# Patient Record
Sex: Male | Born: 1950 | Race: White | Hispanic: No | Marital: Single | Smoking: Former smoker
Health system: Southern US, Community
[De-identification: ages and names within clinical notes are randomized; demographics above are authoritative.]

## PROBLEM LIST (undated history)

## (undated) DIAGNOSIS — Z5189 Encounter for other specified aftercare: Secondary | ICD-10-CM

## (undated) DIAGNOSIS — I2510 Atherosclerotic heart disease of native coronary artery without angina pectoris: Secondary | ICD-10-CM

## (undated) DIAGNOSIS — M1990 Unspecified osteoarthritis, unspecified site: Secondary | ICD-10-CM

## (undated) DIAGNOSIS — I10 Essential (primary) hypertension: Secondary | ICD-10-CM

## (undated) DIAGNOSIS — K219 Gastro-esophageal reflux disease without esophagitis: Secondary | ICD-10-CM

## (undated) DIAGNOSIS — E785 Hyperlipidemia, unspecified: Secondary | ICD-10-CM

## (undated) DIAGNOSIS — L501 Idiopathic urticaria: Secondary | ICD-10-CM

## (undated) DIAGNOSIS — L309 Dermatitis, unspecified: Secondary | ICD-10-CM

## (undated) HISTORY — DX: Unspecified osteoarthritis, unspecified site: M19.90

## (undated) HISTORY — DX: Essential (primary) hypertension: I10

## (undated) HISTORY — DX: Idiopathic urticaria: L50.1

## (undated) HISTORY — DX: Hyperlipidemia, unspecified: E78.5

## (undated) HISTORY — DX: Encounter for other specified aftercare: Z51.89

## (undated) HISTORY — DX: Atherosclerotic heart disease of native coronary artery without angina pectoris: I25.10

---
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
## 2002-10-23 HISTORY — PX: ANAL FISTULECTOMY: SHX1139

## 2006-10-23 HISTORY — PX: CORONARY STENT PLACEMENT: SHX1402

## 2007-08-24 ENCOUNTER — Emergency Department (HOSPITAL_COMMUNITY): Admission: EM | Admit: 2007-08-24 | Discharge: 2007-08-24 | Payer: Self-pay | Admitting: Emergency Medicine

## 2007-09-16 ENCOUNTER — Inpatient Hospital Stay (HOSPITAL_COMMUNITY): Admission: EM | Admit: 2007-09-16 | Discharge: 2007-09-18 | Payer: Self-pay | Admitting: Family Medicine

## 2007-09-16 ENCOUNTER — Ambulatory Visit: Payer: Self-pay | Admitting: Internal Medicine

## 2007-09-16 DIAGNOSIS — Z955 Presence of coronary angioplasty implant and graft: Secondary | ICD-10-CM

## 2007-09-16 HISTORY — DX: Presence of coronary angioplasty implant and graft: Z95.5

## 2007-09-18 ENCOUNTER — Ambulatory Visit: Payer: Self-pay | Admitting: *Deleted

## 2007-09-18 ENCOUNTER — Encounter: Payer: Self-pay | Admitting: Cardiology

## 2007-09-23 ENCOUNTER — Ambulatory Visit: Payer: Self-pay | Admitting: Cardiology

## 2007-11-07 ENCOUNTER — Ambulatory Visit: Payer: Self-pay | Admitting: Cardiology

## 2007-11-07 LAB — CONVERTED CEMR LAB
AST: 30 units/L (ref 0–37)
Alkaline Phosphatase: 56 units/L (ref 39–117)
Cholesterol: 148 mg/dL (ref 0–200)
Total Protein: 6.9 g/dL (ref 6.0–8.3)
Triglycerides: 44 mg/dL (ref 0–149)
VLDL: 9 mg/dL (ref 0–40)

## 2008-01-03 ENCOUNTER — Ambulatory Visit: Payer: Self-pay | Admitting: Internal Medicine

## 2008-06-11 ENCOUNTER — Ambulatory Visit: Payer: Self-pay | Admitting: Internal Medicine

## 2008-06-22 ENCOUNTER — Emergency Department (HOSPITAL_COMMUNITY): Admission: EM | Admit: 2008-06-22 | Discharge: 2008-06-22 | Payer: Self-pay | Admitting: Family Medicine

## 2009-01-15 ENCOUNTER — Encounter (INDEPENDENT_AMBULATORY_CARE_PROVIDER_SITE_OTHER): Payer: Self-pay | Admitting: *Deleted

## 2009-01-15 DIAGNOSIS — I2510 Atherosclerotic heart disease of native coronary artery without angina pectoris: Secondary | ICD-10-CM

## 2009-01-15 DIAGNOSIS — I10 Essential (primary) hypertension: Secondary | ICD-10-CM

## 2009-01-15 DIAGNOSIS — E785 Hyperlipidemia, unspecified: Secondary | ICD-10-CM

## 2009-01-15 HISTORY — DX: Atherosclerotic heart disease of native coronary artery without angina pectoris: I25.10

## 2009-01-15 HISTORY — DX: Hyperlipidemia, unspecified: E78.5

## 2009-01-15 HISTORY — DX: Essential (primary) hypertension: I10

## 2009-02-03 ENCOUNTER — Ambulatory Visit: Payer: Self-pay | Admitting: Internal Medicine

## 2009-02-03 LAB — CONVERTED CEMR LAB
AST: 32 units/L (ref 0–37)
Cholesterol: 228 mg/dL — ABNORMAL HIGH (ref 0–200)
Direct LDL: 132.7 mg/dL
HDL: 67.5 mg/dL (ref >39.00)
Total CHOL/HDL Ratio: 3

## 2009-02-11 ENCOUNTER — Encounter: Payer: Self-pay | Admitting: Internal Medicine

## 2009-02-11 ENCOUNTER — Ambulatory Visit: Payer: Self-pay | Admitting: Internal Medicine

## 2009-02-26 ENCOUNTER — Telehealth: Payer: Self-pay | Admitting: Internal Medicine

## 2009-03-01 ENCOUNTER — Telehealth: Payer: Self-pay | Admitting: Internal Medicine

## 2009-04-02 ENCOUNTER — Telehealth: Payer: Self-pay | Admitting: Internal Medicine

## 2009-07-09 ENCOUNTER — Ambulatory Visit: Payer: Self-pay | Admitting: Family Medicine

## 2009-07-09 DIAGNOSIS — L501 Idiopathic urticaria: Secondary | ICD-10-CM | POA: Insufficient documentation

## 2009-07-09 HISTORY — DX: Idiopathic urticaria: L50.1

## 2009-09-08 ENCOUNTER — Encounter (INDEPENDENT_AMBULATORY_CARE_PROVIDER_SITE_OTHER): Payer: Self-pay | Admitting: *Deleted

## 2009-10-12 ENCOUNTER — Telehealth: Payer: Self-pay | Admitting: Family Medicine

## 2009-11-15 ENCOUNTER — Telehealth: Payer: Self-pay | Admitting: Internal Medicine

## 2009-11-25 ENCOUNTER — Ambulatory Visit: Payer: Self-pay | Admitting: Internal Medicine

## 2009-12-21 HISTORY — PX: WRIST FUSION: SHX839

## 2010-05-27 ENCOUNTER — Telehealth: Payer: Self-pay | Admitting: Internal Medicine

## 2010-11-16 ENCOUNTER — Other Ambulatory Visit: Payer: Self-pay | Admitting: Family Medicine

## 2010-11-16 ENCOUNTER — Telehealth: Payer: Self-pay | Admitting: Family Medicine

## 2010-11-16 ENCOUNTER — Ambulatory Visit
Admission: RE | Admit: 2010-11-16 | Discharge: 2010-11-16 | Payer: Self-pay | Source: Home / Self Care | Attending: Family Medicine | Admitting: Family Medicine

## 2010-11-16 LAB — BASIC METABOLIC PANEL
CO2: 26 mEq/L (ref 19–32)
Chloride: 104 mEq/L (ref 96–112)
Glucose, Bld: 83 mg/dL (ref 70–99)
Sodium: 139 mEq/L (ref 135–145)

## 2010-11-16 LAB — CBC WITH DIFFERENTIAL/PLATELET
Basophils Relative: 0.8 % (ref 0.0–3.0)
Eosinophils Absolute: 0.3 10*3/uL (ref 0.0–0.7)
Eosinophils Relative: 5.8 % — ABNORMAL HIGH (ref 0.0–5.0)
HCT: 40.4 % (ref 39.0–52.0)
Hemoglobin: 13.9 g/dL (ref 13.0–17.0)
Lymphocytes Relative: 52.7 % — ABNORMAL HIGH (ref 12.0–46.0)
MCV: 91.8 fl (ref 78.0–100.0)
Monocytes Absolute: 0.2 10*3/uL (ref 0.1–1.0)
Monocytes Relative: 5 % (ref 3.0–12.0)
Neutro Abs: 1.7 10*3/uL (ref 1.4–7.7)
Platelets: 164 10*3/uL (ref 150.0–400.0)
RBC: 4.4 Mil/uL (ref 4.22–5.81)
RDW: 13.5 % (ref 11.5–14.6)

## 2010-11-16 LAB — URINALYSIS, ROUTINE W REFLEX MICROSCOPIC
Bilirubin Urine: NEGATIVE
Specific Gravity, Urine: 1.02 (ref 1.000–1.030)
Total Protein, Urine: NEGATIVE
pH: 5.5 (ref 5.0–8.0)

## 2010-11-16 LAB — TSH: TSH: 1.01 u[IU]/mL (ref 0.35–5.50)

## 2010-11-16 LAB — PSA: PSA: 1.03 ng/mL (ref 0.10–4.00)

## 2010-11-16 LAB — TESTOSTERONE: Testosterone: 467.03 ng/dL (ref 350.00–890.00)

## 2010-11-16 LAB — LIPID PANEL
Cholesterol: 139 mg/dL (ref 0–200)
Triglycerides: 41 mg/dL (ref 0.0–149.0)

## 2010-11-20 LAB — CONVERTED CEMR LAB
AST: 31 units/L (ref 0–37)
LDL Cholesterol: 69 mg/dL (ref 0–99)
VLDL: 11.8 mg/dL (ref 0.0–40.0)

## 2010-11-21 ENCOUNTER — Ambulatory Visit
Admission: RE | Admit: 2010-11-21 | Discharge: 2010-11-21 | Payer: Self-pay | Source: Home / Self Care | Attending: Family Medicine | Admitting: Family Medicine

## 2010-11-24 NOTE — Progress Notes (Signed)
Summary: please order testosterone lab  Phone Note Call from Patient Call back at Home Phone [phone]   Caller: Patient---live call Reason for Call: Acute Illness Summary of Call: pt just had v70 labs this morning and is requesting testosterone to be checked. is this ok to add? please order. Initial call taken by: Warnell Forester,  November 16, 2010 8:08 AM  Follow-up for Phone Call        OK to add Follow-up by: Evelena Peat MD,  November 16, 2010 12:47 PM  Additional Follow-up for Phone Call Additional follow up Details #1::        spoke with gwen. will send add on sheet. pt is aware. Additional Follow-up by: Warnell Forester,  November 16, 2010 3:24 PM

## 2010-11-24 NOTE — Assessment & Plan Note (Signed)
Summary: 9 MO F/U   Primary Provider:  Evelena Peat MD   History of Present Illness: Patient is a 60 year old with a history of CAD (s/p PTCA/stent to the LAD in 2008.  I last saw him in april 2010.  Since seen, he has done well from a cardiac standpoint.  He denies CP, no shortness of breath.  He remains fairly active though weather and hand injury have slowed him some. [facility]  It is usually in 120s / 70s.   Current Medications (verified): 1)  Aspirin 81 Mg Tbec (Aspirin) .... Take One Tablet By Mouth 2 Times Daily 2)  Nitroglycerin 0.4 Mg Subl (Nitroglycerin) .... One Tablet Under Tongue Every 5 Minutes As Needed For Chest Pain---May Repeat Times Three 3)  Lipitor 40 Mg Tabs (Atorvastatin Calcium) .... Take One Tablet By Mouth Daily. 4)  Desoximetasone 0.25 % Crea (Desoximetasone) .... Apply To Effected Area As Needed 60 Gm Tube  Allergies (verified): 1)  ! Augmentin  Past History:  Past Medical History: Last updated: 07/09/2009 HYPERTENSION, UNSPECIFIED (ICD-401.9) DYSLIPIDEMIA (ICD-272.4) CAD (ICD-414.00)  UTI  Past Surgical History: Last updated: 07/09/2009 He has had a colonoscopy.  Social History: Last updated: 07/09/2009 Full Time Tobacco Use - Quit 03/2008 Alcohol Use - yes -- 12 pack or more per day on weekend Regular Exercise - no Drug Use - no excessive caffeine  Review of Systems       Question allergic rxn to Augmentin   Vital Signs:  Patient profile:   60 year old male Height:      69.75 inches Weight:      183 pounds BMI:     26.54 Pulse rate:   64 / minute Resp:     16 per minute BP sitting:   147 / 88  (right arm)  Vitals Entered By: Marrion Coy, CNA (November 25, 2009 8:29 AM)  Physical Exam  Additional Exam:  HEENT:  Normocephalic, atraumatic. EOMI, PERRLA.  Neck: JVP is normal. No thyromegaly. No bruits.  Lungs: clear to auscultation. No rales no wheezes.  Heart: Regular rate  and rhythm. Normal S1, S2. No S3.   No significant murmurs. PMI not displaced.  Abdomen:  Supple, nontender. Normal bowel sounds. No masses. No hepatomegaly.  Extremities:   Good distal pulses throughout. No lower extremity edema.   L arm in splint. Musculoskeletal :moving all extremities.  Neuro:   alert and oriented x3.    EKG  Procedure date:  11/25/2009  Findings:      NSR.  64 bpm  Impression & Recommendations:  Problem # 1:  CAD (ICD-414.00) Clinically stable.  No angina.  Continue to follow.  Stay active.  Problem # 2:  DYSLIPIDEMIA (ICD-272.4) Will check lipids today.  Patient did have a cup of coffee with a little bit of half and half a few hours ago.  Will take in acct when get numbers.  Problem # 3:  HYPERTENSION, UNSPECIFIED (ICD-401.9) BP on my check is 120/90.  May have a little white coat effect.  Told him to continue to check fairly regularly.  If greater than 145/88 call.  Other Orders: EKG w/ Interpretation (93000) TLB-AST (SGOT) (84450-SGOT) TLB-Lipid Panel (80061-LIPID)  Patient Instructions: 1)  Your physician recommends that you return for lab work in: lab work today..we will call you with results 2)  Your physician wants you to follow-up in: 12 months  You will receive a reminder letter  in the mail two months in advance. If you don't receive a letter, please call our office to schedule the follow-up appointment.

## 2010-11-24 NOTE — Progress Notes (Signed)
Summary: needs lipitor resent  Phone Note Call from Patient   Caller: Patient  [phone] Reason for Call: Talk to Nurse Summary of Call: pt needs lipitor rx resent-they told him it was denied-it shows approved  Initial call taken by: Glynda Jaeger,  May 27, 2010 11:46 AM  Follow-up for Phone Call        resent Follow-up by: Burnett Kanaris, CNA,  May 27, 2010 4:44 PM

## 2010-11-24 NOTE — Progress Notes (Signed)
Summary: refill  Phone Note Refill Request Message from:  Patient on November 15, 2009 9:57 AM  Refills Requested: Medication #1:  LIPITOR 40 MG TABS Take one tablet by mouth daily. [facility]  [phone]  Initial call taken by: Judie Grieve,  November 15, 2009 9:58 AM  Follow-up for Phone Call        [facility] Follow-up by: Oswald Hillock,  November 15, 2009 1:13 PM    Prescriptions: LIPITOR 40 MG TABS (ATORVASTATIN CALCIUM) Take one tablet by mouth daily.  #90 x 0   Entered by:   Oswald Hillock   Authorized by:   Sherrill Raring, MD, [facility]   Signed by:   Oswald Hillock on 11/15/2009   Method used:   [facility]        [phone] ([facility]       [address] [place], [place]  [address]       Ph: [phone] or [phone]       Fax: [phone]   RxID:   [id number]

## 2010-11-30 NOTE — Assessment & Plan Note (Signed)
Summary: cpx/njr   Vital Signs:  Patient profile:   60 year old male Height:      69.75 inches Weight:      175 pounds BMI:     25.38 Temp:     97.8 degrees F oral Pulse rate:   72 / minute Pulse rhythm:   regular Resp:     12 per minute BP sitting:   142 / 80  (left arm) Cuff size:   regular  Vitals Entered By: Sid Falcon LPN (November 21, 2010 11:22 AM)  Nutrition Counseling: Patient's BMI is greater than 25 and therefore counseled on weight management options. CC: CPX   History of Present Illness: Patient for complete physical examination. He has history of CAD which has been stable. Followed by cardiologist. History of dyslipidemia treated with Lipitor. Patient compliant with therapy.  Prior colonoscopy 2004.  tetanus is up to date. No hx of pneumovax.  Clinical Review Panels:  Prevention   Last Colonoscopy:  normal (10/23/2002)   Last PSA:  1.03 (11/16/2010)  Immunizations   Last Tetanus Booster:  Historical (10/24/2007)   Last Pneumovax:  Pneumovax (11/21/2010)  Lipid Management   Cholesterol:  139 (11/16/2010)   LDL (bad choesterol):  59 (11/16/2010)   HDL (good cholesterol):  71.60 (11/16/2010)  Diabetes Management   Creatinine:  0.8 (11/16/2010)   Last Pneumovax:  Pneumovax (11/21/2010)  CBC   WBC:  4.8 (11/16/2010)   RBC:  4.40 (11/16/2010)   Hgb:  13.9 (11/16/2010)   Hct:  40.4 (11/16/2010)   Platelets:  164.0 (11/16/2010)   MCV  91.8 (11/16/2010)   MCHC  34.3 (11/16/2010)   RDW  13.5 (11/16/2010)   PMN:  35.7 (11/16/2010)   Lymphs:  52.7 (11/16/2010)   Monos:  5.0 (11/16/2010)   Eosinophils:  5.8 (11/16/2010)   Basophil:  0.8 (11/16/2010)  Complete Metabolic Panel   Glucose:  83 (11/16/2010)   Sodium:  139 (11/16/2010)   Potassium:  4.4 (11/16/2010)   Chloride:  104 (11/16/2010)   CO2:  26 (11/16/2010)   BUN:  10 (11/16/2010)   Creatinine:  0.8 (11/16/2010)   Albumin:  4.2 (11/16/2010)   Total Protein:  6.7 (11/16/2010)   Calcium:   9.3 (11/16/2010)   Total Bili:  0.9 (11/16/2010)   Alk Phos:  50 (11/16/2010)   SGPT (ALT):  47 (11/16/2010)   SGOT (AST):  45 (11/16/2010)   Allergies: 1)  ! Augmentin  Past History:  Past Medical History: Last updated: 07/09/2009 HYPERTENSION, UNSPECIFIED (ICD-401.9) DYSLIPIDEMIA (ICD-272.4) CAD (ICD-414.00)  UTI  Past Surgical History: Last updated: 07/09/2009 He has had a colonoscopy.  Family History: Last updated: 01/15/2009  He has family history of premature coronary artery   disease and ongoing tobacco use.  Of note, he has not had a checkup in   greater than ten years.  His mother died at age 41 of lung disease and   he thinks she had an MI at age 4.  His father died at age 32 of heart   failure and the patient states he also had a history of angina.  He has   two siblings that have died of cancer and two that are healthy.   Social History: Last updated: 07/09/2009 Full Time Tobacco Use - Quit 03/2008 Alcohol Use - yes -- 12 pack or more per day on weekend Regular Exercise - no Drug Use - no excessive caffeine  Risk Factors: Exercise: no (01/15/2009)  Risk Factors: Smoking Status: current (01/15/2009) PMH-FH-SH reviewed  for relevance  Review of Systems  The patient denies anorexia, fever, weight gain, vision loss, decreased hearing, hoarseness, chest pain, syncope, dyspnea on exertion, peripheral edema, prolonged cough, headaches, hemoptysis, abdominal pain, melena, hematochezia, severe indigestion/heartburn, hematuria, incontinence, genital sores, muscle weakness, suspicious skin lesions, transient blindness, difficulty walking, depression, unusual weight change, abnormal bleeding, enlarged lymph nodes, and testicular masses.         recent fatigue issues with normal testosterone levels.  Physical Exam  General:  Well-developed,well-nourished,in no acute distress; alert,appropriate and cooperative throughout examination Head:  Normocephalic and  atraumatic without obvious abnormalities. No apparent alopecia or balding. Eyes:  No corneal or conjunctival inflammation noted. EOMI. Perrla. Funduscopic exam benign, without hemorrhages, exudates or papilledema. Vision grossly normal. Ears:  External ear exam shows no significant lesions or deformities.  Otoscopic examination reveals clear canals, tympanic membranes are intact bilaterally without bulging, retraction, inflammation or discharge. Hearing is grossly normal bilaterally. Mouth:  Oral mucosa and oropharynx without lesions or exudates.  Teeth in good repair. Neck:  No deformities, masses, or tenderness noted. Lungs:  Normal respiratory effort, chest expands symmetrically. Lungs are clear to auscultation, no crackles or wheezes. Heart:  normal rate, regular rhythm, and no gallop.   Abdomen:  Bowel sounds positive,abdomen soft and non-tender without masses, organomegaly or hernias noted. Rectal:  No external abnormalities noted. Normal sphincter tone. No rectal masses or tenderness. Prostate:  Prostate gland firm and smooth, no enlargement, nodularity, tenderness, mass, asymmetry or induration. Msk:  No deformity or scoliosis noted of thoracic or lumbar spine.   Extremities:  No clubbing, cyanosis, edema, or deformity noted with normal full range of motion of all joints.   Neurologic:  alert & oriented X3, cranial nerves II-XII intact, and strength normal in all extremities.   Skin:  no rashes and no suspicious lesions.   Cervical Nodes:  No lymphadenopathy noted Psych:  Cognition and judgment appear intact. Alert and cooperative with normal attention span and concentration. No apparent delusions, illusions, hallucinations   Impression & Recommendations:  Problem # 1:  Preventive Health Care (ICD-V70.0) PVX recommended and given after pt consent.  Labs reviewed with pt.  Hemoccults given.  Complete Medication List: 1)  Aspirin 81 Mg Tbec (Aspirin) .... Take one tablet by mouth 2  times daily 2)  Nitroglycerin 0.4 Mg Subl (Nitroglycerin) .... One tablet under tongue every 5 minutes as needed for chest pain---may repeat times three 3)  Lipitor 40 Mg Tabs (Atorvastatin calcium) .... Take one tablet by mouth daily. 4)  Desoximetasone 0.25 % Crea (Desoximetasone) .... Apply to effected area as needed 60 gm tube 5)  Xalatan 0.005 % Soln (Latanoprost) .... One drop to each eye daily as needed  Other Orders: Pneumococcal Vaccine (16109) Admin 1st Vaccine (60454)  Patient Instructions: 1)  Please schedule a follow-up appointment in 1 year.  2)  It is important that you exercise reguarly at least 20 minutes 5 times a week. If you develop chest pain, have severe difficulty breathing, or feel very tired, stop exercising immediately and seek medical attention.    Orders Added: 1)  Pneumococcal Vaccine [90732] 2)  Admin 1st Vaccine [90471] 3)  Est. Patient 40-64 years [99396]   Immunizations Administered:  Pneumonia Vaccine:    Vaccine Type: Pneumovax    Site: left deltoid    Mfr: Merck    Dose: 0.5 ml    Route: IM    Given by: Sid Falcon LPN    Exp. Date: 03/16/2012  Lot #: [id number]   Immunizations Administered:  Pneumonia Vaccine:    Vaccine Type: Pneumovax    Site: left deltoid    Mfr: Merck    Dose: 0.5 ml    Route: IM    Given by: Sid Falcon LPN    Exp. Date: 03/16/2012    Lot #: [id number]

## 2010-12-16 ENCOUNTER — Ambulatory Visit: Payer: Self-pay | Admitting: Internal Medicine

## 2010-12-23 ENCOUNTER — Encounter: Payer: Self-pay | Admitting: Internal Medicine

## 2010-12-23 ENCOUNTER — Ambulatory Visit (INDEPENDENT_AMBULATORY_CARE_PROVIDER_SITE_OTHER): Payer: PRIVATE HEALTH INSURANCE | Admitting: Internal Medicine

## 2010-12-23 DIAGNOSIS — I2510 Atherosclerotic heart disease of native coronary artery without angina pectoris: Secondary | ICD-10-CM

## 2010-12-23 DIAGNOSIS — E7800 Pure hypercholesterolemia, unspecified: Secondary | ICD-10-CM

## 2010-12-29 NOTE — Assessment & Plan Note (Signed)
Summary: per checkout/sf   Visit Type:  Follow-up Primary Provider:  Evelena Peat MD  CC:  no complaints.  History of Present Illness: Patient is a 60 year old with a history of CAD (s/p PTCA/stent to the LAD in 2008.  I last saw him in sping 2011. Since see he has been followed by B. Burchette.  Lipid values drawn.  LDL was 59, HDL was 71 He remains very active.  No chest pain.  NO SOB.  Keeps active.  Exercises on bike 1 hr per day.  Current Medications (verified): 1)  Aspirin 81 Mg Tbec (Aspirin) .... Take One Tablet By Mouth 2 Times Daily 2)  Nitroglycerin 0.4 Mg Subl (Nitroglycerin) .... One Tablet Under Tongue Every 5 Minutes As Needed For Chest Pain---May Repeat Times Three 3)  Lipitor 40 Mg Tabs (Atorvastatin Calcium) .... Take One Tablet By Mouth Daily. 4)  Desoximetasone 0.25 % Crea (Desoximetasone) .... Apply To Effected Area As Needed 60 Gm Tube 5)  Xalatan 0.005 % Soln (Latanoprost) .... One Drop To Each Eye Daily As Needed  Allergies (verified): 1)  ! Augmentin  Past History:  Past medical, surgical, family and social histories (including risk factors) reviewed, and no changes noted (except as noted below).  Past Medical History: Reviewed history from 07/09/2009 and no changes required. HYPERTENSION, UNSPECIFIED (ICD-401.9) DYSLIPIDEMIA (ICD-272.4) CAD (ICD-414.00)  UTI  Past Surgical History: Reviewed history from 07/09/2009 and no changes required. He has had a colonoscopy.  Family History: Reviewed history from 01/15/2009 and no changes required.  He has family history of premature coronary artery   disease and ongoing tobacco use.  Of note, he has not had a checkup in   greater than ten years.  His mother died at age 21 of lung disease and   he thinks she had an MI at age 61.  His father died at age 72 of heart   failure and the patient states he also had a history of angina.  He has   two siblings that have died of cancer and two that are healthy.    Social History: Reviewed history from 07/09/2009 and no changes required. Full Time Tobacco Use - Quit 03/2008 Alcohol Use - yes -- 12 pack or more per day on weekend Regular Exercise - no Drug Use - no excessive caffeine  Review of Systems       All systems reviewed.  Neg to the above problem except as noted above.  Vital Signs:  Patient profile:   60 year old male Height:      69 inches Weight:      175 pounds BMI:     25.94 Pulse rate:   61 / minute BP sitting:   119 / 76  (left arm) Cuff size:   regular  Vitals Entered By: Burnett Kanaris, CNA (December 23, 2010 4:23 PM)  Physical Exam  Additional Exam:  Patient is in NAD HEENT:  Normocephalic, atraumatic. EOMI, PERRLA.  Neck: JVP is normal. No thyromegaly. No bruits.  Lungs: clear to auscultation. No rales no wheezes.  Heart: Regular rate and rhythm. Normal S1, S2. No S3.   No significant murmurs. PMI not displaced.  Abdomen:  Supple, nontender. Normal bowel sounds. No masses. No hepatomegaly.  Extremities:   Good distal pulses throughout. No lower extremity edema.  Musculoskeletal :moving all extremities.  Neuro:   alert and oriented x3.    EKG  Procedure date:  12/23/2010  Findings:      NSR.  60 bpm.  Impression & Recommendations:  Problem # 1:  CAD (ICD-414.00) Doing very well.  Continues to stay active.  No changes  Problem # 2:  HYPERTENSION, UNSPECIFIED (ICD-401.9) BP is good.  Problem # 3:  DYSLIPIDEMIA (ICD-272.4) Lipids are excellen.  Patinet would like to cut back on Lipitor some.  Would decrease to 20.  Check  Lipomed in 3 months. His updated medication list for this problem includes:    Lipitor 40 Mg Tabs (Atorvastatin calcium) .Marland Kitchen... Take one half tablet by mouth daily.  Other Orders: EKG w/ Interpretation (93000)  Patient Instructions: 1)  Your physician recommends that you return for lab work in: LIPOMED at [facility] lab FASTING ist week of JUNE 2)  Your physician wants you to follow-up  in: 12 months with Dr.Inez Stantz  You will receive a reminder letter in the mail two months in advance. If you don't receive a letter, please call our office to schedule the follow-up appointment.

## 2011-02-23 ENCOUNTER — Other Ambulatory Visit: Payer: Self-pay | Admitting: *Deleted

## 2011-02-23 MED ORDER — ATORVASTATIN CALCIUM 40 MG PO TABS: 20.0000 mg | ORAL_TABLET | Freq: Every day | ORAL | Status: DC

## 2011-02-24 ENCOUNTER — Other Ambulatory Visit: Payer: Self-pay | Admitting: *Deleted

## 2011-02-24 MED ORDER — ATORVASTATIN CALCIUM 40 MG PO TABS: 40.0000 mg | ORAL_TABLET | Freq: Every day | ORAL | Status: DC

## 2011-03-07 NOTE — H&P (Signed)
NAMEJERRALD, [name] NO.:  [id number]   MEDICAL RECORD NO.:  [id number]          PATIENT TYPE:  OBV   LOCATION:  6522                         FACILITY:  [facility]   PHYSICIAN:  Pricilla Riffle, MD, FACCDATE OF BIRTH:  15-Oct-1951   DATE OF ADMISSION:  09/16/2007  DATE OF DISCHARGE:                              HISTORY & PHYSICAL   PRIMARY CARE PHYSICIAN:  In the past, was Dr. Kelle Darting but has not  seen him in greater than five years.   CHIEF COMPLAINT:  Chest pain.   HISTORY OF PRESENT ILLNESS:  Mr. [name] is a 60 year old male with no  previous history of coronary artery disease or cardiac evaluation.  He  had an episode of chest pain two days ago in the evening after having  done an unusual amount of yard work that day.  The symptoms resolved  with rest.  He describes them as a chest pressure.  Today, he had  similar symptoms while driving.  It reached a 6/10.  It was pressure all  over his chest.  It was associated with nausea and shortness of breath  as well as dizziness and some diaphoresis.  It did not radiate.  He  stopped at a convenient store and bought aspirin and chewed two full-  strength aspirins.  He feels like his symptoms resolved about 5 to 10  minutes later without further intervention.  He feels that the episode  lasted no longer than about 20 minutes.  He has a history of mild reflux  symptoms, but he states that these were unlike those symptoms and he  also states that he has a history of panic attacks, although these have  not been formally diagnosed.  He feels that these symptoms are different  from his previous panic attack symptoms.  He is currently symptom-free.   PAST MEDICAL HISTORY:  He has no history of diabetes, hypertension or  hyperlipidemia.   FAMILY HISTORY:  He has family history of premature coronary artery  disease and ongoing tobacco use.  Of note, he has not had a checkup in  greater than ten years.  His mother died at age  58 of lung disease and  he thinks she had an MI at age 47.  His father died at age 78 of heart  failure and the patient states he also had a history of angina.  He has  two siblings that have died of cancer and two that are healthy.   PAST SURGICAL HISTORY:  He has had a colonoscopy.   ALLERGIES:  No known drug allergies.   MEDICATIONS:  He takes no prescription drugs.  Over-the-counter vitamins  and supplements include multivitamin, B pollen, fish oil, glucosamine,  coenzyme Q10, GLA/CLA, E-glutamine, BMAE 250, Estazolam and a baby  aspirin daily.   SOCIAL HISTORY:  He lives in [place] alone and works in [employer].  He  has approximately 20-pack-year history of tobacco use.  He drinks beer  mainly on the weekends, but will drink over a 12-pack a day on the  weekends.  He drinks about two or three cups  of coffee a day and  occasionally drinks tea, including green tea but denies further use of  caffeine or energy drinks.  He has had an injury to his foot and,  therefore, has not exercised in the last four months.  He denies drug  use.   REVIEW OF SYSTEMS:  He has had no recent fevers, chills or sweats.  He  wears glasses but does not require hearing aids.  The chest pain is as  described above.  He coughs occasionally but denies wheezing.  He has  had no pre-syncope, syncope or claudication symptoms.  He has a history  of some sort of arthritis with arthralgias, but states that since being  on the glucosamine this is greatly improved.  He denies depression or  anxiety at this time.  He has occasionally reflux symptoms but denies  melena, hematemesis or hemoptysis.  A full 14-point review of systems is  otherwise negative.   PHYSICAL EXAMINATION:  VITAL SIGNS:  Temperature is 97.7, blood pressure  158/88, pulse 73, respiratory rate 24, O2 saturation 98% on room air.  GENERAL:  He is a well-developed, well-nourished, white male in no acute  distress.  HEENT:  Normal.  NECK:  There  is no lymphadenopathy, thyromegaly, bruit or JVD noted.  CV:  His heart is regular in rate and rhythm with an S1 and S2 and no  significant murmur, rub or gallop is noted.  He has a normal PMI.  Distal pulses are intact in all four extremities with no femoral bruits  that are appreciated.  LUNGS:  Are essentially clear to auscultation bilaterally.  SKIN:  No rashes or lesions are noted.  ABDOMEN:  Soft and nontender with active bowel sounds and no abdominal  masses are noted.  EXTREMITIES:  There is no cyanosis, clubbing or edema.  MUSCULOSKELETAL:  There is no joint deformity or effusions.  NEUROLOGIC:  He is alert and oriented.  Cranial nerves II-XII are  grossly intact.   Chest x-ray:  No active disease.   EKG:  Sinus rhythm with no acute ischemic changes and no old is  available for comparison.   Laboratory values:  Hemoglobin 13.8, hematocrit 39.6, WBCs 5.3,  platelets 181, sodium 136, potassium 4.3, chloride 102, BUN 16,  creatinine 0.97, glucose 95.  Initial point of care markers negative x2.   IMPRESSION:  1. Chest pain:  Mr. [name] is a 60 year old male with multiple cardiac      risk factors including age, gender, unknown lipid status, ongoing      tobacco use and family of premature coronary artery disease.  He      had exertional chest pain with significant exertion and then today,      had another episode of chest pain with minimal exertion.  His chest      pain is described as a pressure with multiple associated symptoms.      It is dissimilar to his previous reflux and possible panic attacks.      His symptoms are concerning for progressive angina.  He will be      admitted to the hospital.  Cardiac catheterization will be      undertaken to further define his anatomy.  Lipid profile and TSH      are pending.  Further evaluation and treatment will depend on the      results of the above testing.      Theodore Demark, PA-C      Pricilla Riffle, MD,  [facility]   Electronically Signed    RB/MEDQ  D:  09/16/2007  T:  09/17/2007  Job:  [id number]

## 2011-03-07 NOTE — Cardiovascular Report (Signed)
NAME:  [name], [name] NO.:  [id number]   MEDICAL RECORD NO.:  [id number]          PATIENT TYPE:  OBV   LOCATION:  6522                         FACILITY:  [facility]   PHYSICIAN:  Everardo Beals. 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  He has no prior history of known heart  disease but he is a smoker and has a strong positive family history of  heart disease.  Today, while driving in his car he developed severe  substernal pressure that made him turn around.  He went home and took  two full-dose chewable aspirin and then drove himself to the hospital.  He was seen in emergency room by Dr. Tenny Craw who felt his symptoms were  strongly suggestive of ischemia and arranged for a catheterization.   PROCEDURE:  The procedure was performed by the right femoral artery  using an arterial sheath and 6-French preformed coronary catheters.  A  front wall arterial puncture was performed and Omnipaque contrast was  used.  After completion of diagnostic study we made a decision to  proceed with IVUS to evaluate a lesion in the proximal LAD.  The patient  given bivalirudin bolus and infusion and had previously taken chewable  aspirin, and was given 600 mg of Plavix and 20 mg of Pepcid.  We used a  Q-4 6-French guiding catheter with side holes.  We passed a Prowater  wire across the lesion in the proximal and ostial LAD to the distal  vessel.  We passed an Atlantis catheter distal to the lesion and  performed automatic pullback.  This showed the lesion was quite tight  with a lumen that was a little less than 2 mm in diameter with a vessel  that was 4 mm in diameter.  We chose initially to treat the vessel with  a 3.0 stent.  The distal lumen was about 3.25.  We deployed a 3.0 x 15-  mm Promus stent, trying carefully to position the proximal edge just at  the ostium of the LAD.  This was somewhat difficult because there was  some movement of the stent and balloon.  We deployed this with one  inflation of 12 atmospheres for 20 seconds.  We then postdilated with a  3.25 x 12-mm Quantum Maverick, performing multiple inflations up to 14  atmospheres for 30 seconds.  We had difficulty because the balloon  tended to squeegee distal in the LAD rather than stay in place.  We had  to position the balloon proximal to the edge of the stent and into the  left main in order to dilate the proximal edge of the stent.  We then  performed an IVUS run and felt like the stent could be expanded further.  We then went in with a 3.5 x 12-mm Quantum Maverick and performed three  inflations up to 14 atmospheres for 20 seconds.  Final diagnostic  studies were  then performed through a guiding catheter.  The patient  tolerated the procedure well and left the laboratory in satisfactory  condition.   RESULTS:  The left main coronary artery was free of significant disease.   The left anterior descending artery gave rise to a diagonal branch and  two septal perforators.  There was 80% narrowing in the proximal LAD  with disease extending just shy of the ostium.   The circumflex artery was a small vessel that supplied only a marginal  vessel and this was free of major obstruction.   The right coronary artery was a huge dominant vessel that supplied two  right ventricular branches, a posterior descending branch, and three  large posterolateral branches.  These vessels were free of significant  disease.   The left ventriculogram performed in the RAO projection showed good wall  motion with no areas of hypokinesis.  The estimated ejection fraction  was 60%.   Following stenting of the lesion in the LAD the stenosis improved from  80% to 0%.   The IVUS measurements before stenting showed a  distal reference lumen of  3.25 and a proximal ostium lumen of about 3.0.  The vessel past the  ostium was a 4.0 vessel and the lumen distal to where we placed the  stent increased to between 3.5 and 4.  When we did the initial IVUS run  there was a good transition both proximally and distally but the stent  dimensions were about 3.0 and so we elected to go back in with the 3.5  balloon.  We did not repeat the IVUS after the 3.5 post dilatation.   CONCLUSION:  1. Coronary artery disease with 80% proximal and ostial stenosis of      the left anterior descending artery, no significant obstruction of      circumflex and right coronary arteries, and normal left ventricular      function.  2. Successful stenting of the lesion in the ostium of the left      anterior descending artery by using a Promus drug-eluting stent      with IVUS guidance, with improvement in percent of narrowing from      80% to 0%.   DISPOSITION:  The patient returned to the post angioplasty unit for  further observation.      [name] Juanda Chance, MD, [facility]  Electronically Signed     BRB/MEDQ  D:  09/16/2007  T:  09/17/2007  Job:  [id number]   cc:   Pricilla Riffle, MD, [facility]  Cardiopulmonary Lab

## 2011-03-07 NOTE — Discharge Summary (Signed)
NAME:  [name], [name] NO.:  [id number]   MEDICAL RECORD NO.:  [id number]          [name] TYPE:  OBV   LOCATION:  6522                         FACILITY:  [facility]   PHYSICIAN:  Everardo Beals. Juanda Chance, MD, FACCDATE OF BIRTH:  07/23/1951   DATE OF ADMISSION:  09/16/2007  DATE OF DISCHARGE:  09/17/2007                               DISCHARGE SUMMARY   DISCHARGING PHYSICIAN:  Dr. Charlies Constable.   PRIMARY CARDIOLOGIST:  Dr. Dietrich Pates.   Marland KitchenPRIMARY CARE:  Dr. Tawanna Cooler.   DISCHARGE DIAGNOSIS:  Acute coronary syndrome status post placement of a drug-eluting stent to  Billy LAD by Dr. Charlies Constable on September 16, 2007.  Pending Doppler study  to post cath site.  [name] tentatively being planned for discharge  home later today.   HOSPITAL COURSE:  [name] is a 60 year old gentleman with no previous  known history of coronary artery disease who presented to Billy emergency  room for chest pain that began on Billy morning of admission.  [name]  took some aspirin at home without resolution of discomfort and decided  to be evaluated.  Initial cardiac enzymes were negative.  [name]  was evaluated by Dr. Dietrich Pates who felt [name] warranted further  evaluation.  [name] was agreeable to cardiac catheterization after  Billy risks and benefits were discussed with [name] to Billy cath lab  by Dr. Charlies Constable.  [name] with LAD stenosis decreased from 80 to  0% status post placement of a drug-eluting stent, EF 60%.  Dr. Juanda Chance in  to see [name] on day of discharge.  [name] noted to have a  right groin hematoma at Billy cath site without bruit.  Duplex of Billy cath  site ordered and if negative, [name] will be discharged home this  afternoon.   MEDICATIONS AT TIME OF DISCHARGE:  1. Plavix 75 mg daily.  2. Aspirin 325 daily.  3. Lipitor 80 mg at bedtime .  4. Toprol XL 50 mg daily.  5. Nitroglycerin as needed for chest pain.  6. [name] came may  continue his previous vitamins.   PLAN:  He will need lipids and LFTs checked in 6 weeks.  He also been  given an eye prescription for nitroglycerin p.r.n. he has been given Billy  post cardiac catheterization discharge instructions.  He will follow up  in Billy office on  Monday, December 1 for post cath check.  Dr. Tenny Craw is not available on  that date.  Dr. Jens Som will see [name] to make sure hematoma to  right groin stable after cath.   DURATION OF DISCHARGE ENCOUNTER:  Less than 30 minutes.      Dorian Pod, ACNP      Bruce R. Juanda Chance, MD, [facility]  Electronically Signed    MB/MEDQ  D:  09/18/2007  T:  09/18/2007  Job:  [id number]   cc:   Tinnie Gens A. Tawanna Cooler, MD

## 2011-03-07 NOTE — Assessment & Plan Note (Signed)
[facility]                            CARDIOLOGY OFFICE NOTE   NAME:[name], Billy ALBERS                     MRN:          [id number]  DATE:06/11/2008                            DOB:          [birth year]    IDENTIFICATION:  [name] is a 59 year old with a history of CAD (status  post PTCA/drug-eluting stent to the LAD in November 2008.  I last saw  him in March of this year.   In the interval he continues to do okay.   Note, he quit smoking.  He stopped the Toprol and says he actually feels  better off and he was feeling quite sluggish.  About 2 weeks ago, he was  waiting in chair and he suddenly had a dizzy spell that was quite short-  lived, no syncope.  He denied palpitations at that time.   CURRENT MEDICINES:  1. Plavix 75.  2. Lipitor 80.  3. Aspirin 81.   PHYSICAL EXAMINATION:  GENERAL:  The patient is in no distress.  VITAL SIGNS:  Blood pressure on arrival 136/88, on my check right arm  145/88, left arm 142/92, pulse is 65 and regular.  Weight 196.  NECK:  No bruits.  JVP is normal.  CARDIAC:  Regular rate and rhythm, S1 and S2.  No S3, no murmurs.  ABDOMEN:  Benign.  EXTREMITIES:  Good distal pulses.  No edema.   IMPRESSION:  1. Coronary artery disease, doing well.  A 12-lead EKG; sinus rhythm,      65 beats per minute.  I would keep him on the Plavix and aspirin      for now.  I have talked to him about his blood pressure.  He will      follow this closely, he may indeed another agent for this.  He will      call back.  2. Dyslipidemia.  He needs to get a fasting lipid.  3. Hypertension, again as noted above.  4. Health care maintenance.  Congratulated on his smoking cessation.   I have encouraged the patient to stay active, again he will be in touch  with his blood pressure.  Otherwise, I will set to see him back later  this winter.     Pricilla Riffle, MD, [facility]  Electronically Signed   PVR/MedQ  DD: 06/11/2008  DT: 06/12/2008   Job #: [id number]   cc:   Tinnie Gens A. Tawanna Cooler, MD

## 2011-03-07 NOTE — Assessment & Plan Note (Signed)
[facility]                            CARDIOLOGY OFFICE NOTE   NAME:[name], [name]                     MRN:          [id number]  DATE:09/23/2007                            DOB:          Aug 01, 1951    [name] is a 60 year old gentleman who was recently seen in the  emergency room by Dr. Tenny Craw secondary to chest pain.  He ultimately  underwent cardiac catheterization by Dr. Juanda Chance.  His catheterization  was performed on September 16, 2007.  He was found to have a normal left  main.  The LAD had an 80% lesion in the proximal vessel.  There was no  right coronary artery or circumflex disease.  His ejection fraction was  normal at 60%.  He had PCI of the LAD with a drug-eluting stent at that  time.  Since discharge, he has done well.  He denies any dyspnea, chest  pain, palpitations, or syncope.  He does have mild fatigue.  He has also  had some residual soreness at the site of his catheterization.  Note, he  did have an ultrasound of his right groin prior to discharge that was  negative.  He has discontinued his tobacco use.   MEDICATIONS:  1. Plavix 75 mg p.o. daily.  2. Lipitor 80 mg p.o. daily.  3. Toprol-XL 50 mg p.o. daily.  4. Aspirin 325 mg p.o. daily.  5. He has nitroglycerin as needed.   PHYSICAL EXAMINATION:  VITAL SIGNS:  Blood pressure of 140/85, pulse 75.  He weighs 194 pounds.  HEENT:  Normal.  NECK:  Supple.  CHEST:  Clear.  CARDIOVASCULAR:  Regular rate and rhythm.  ABDOMEN:  No tenderness.  His right groin shows ecchymosis and a small  hematoma.  There is no bruit.  EXTREMITIES:  No edema.   His electrocardiogram shows sinus rhythm at a rate of 73.  There are no  ST changes.   DIAGNOSES:  1. Coronary artery disease status post drug-eluting stent to the left      anterior descending.  The patient will continue on his aspirin, and      he will need Plavix indefinitely.  He will also continue on his      beta blocker and  statin.  Note, he is complaining of some fatigue,      and this may be related to his Toprol-XL.  If this persists, then      his Toprol-XL could be discontinued and his blood pressure      controlled with other medications, possibly an ACE inhibitor.  2. Hyperlipidemia.  We will continue on his statin.  He will check      lipids and liver in approximately 6 weeks.  3. Hypertension.  His blood pressure is borderline today.  We will      track this and adjust his regimen as indicated.  4. Tobacco abuse.  He has discontinued this.   We will have him see Dr. Tenny Craw back in approximately 3 months.     Madolyn Frieze Jens Som, MD, [facility]  Electronically Signed    BSC/MedQ  DD:  09/23/2007  DT: 09/23/2007  Job #: [id number]   cc:   Tinnie Gens A. Tawanna Cooler, MD

## 2011-03-07 NOTE — Assessment & Plan Note (Signed)
 [facility]                            CARDIOLOGY OFFICE NOTE   NAME:Asebedo, SHEMUEL HARKLEROAD                     MRN:          [id number]  DATE:01/03/2008                            DOB:          [birth year]    IDENTIFICATION:  Mr. Overfelt is a 60 year old gentleman with a history of  CAD.  He is status post PTCA/drug-eluting stent to the LAD back in  November.  He was last seen in Cardiology by Dr. Olga Millers in  December.   In the interval, he has done well.  He has cut down his Toprol to half  daily; he still says it makes him tired, especially while driving.  He  also says he bleeds really easily when he is cut and notes easy  bruising, no chest pain, no shortness of breath.  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   CURRENT MEDICATIONS:  1. Plavix 75.  2. Lipitor 80.  3. Aspirin 325.  4. Toprol-XL 25.   PHYSICAL EXAM:  The patient is in no distress.  Blood pressure is 124/78, pulse 63, weight 192.  LUNGS:  Clear.  CARDIAC:  Regular rate and rhythm, S1-S2.  No murmurs.  ABDOMEN:  Benign.  EXTREMITIES:  No edema.   REVIEW OF SYSTEMS:  Still smoking 5 or 6 cigarettes a day.   IMPRESSION:  1. Coronary artery disease.  Appears to be doing well, EKG with sinus      bradycardia at 56 beats per minute. I told him to go ahead and back      down his Toprol to a quarter daily to see if he notes any change      and call back in a couple of weeks.  I think it is important to      have a little beta blocker on board, given his recent events.  With      his bruising and bleeding, cut the aspirin down to 81 mg today.  We      will check a CBC, platelets, PT and PTT on the current medicines.  2. Dyslipidemia.  Lipid panel in January was very good.  Would      continue.  3. Health care maintenance:  Counseled again on smoking.   I will set to see the patient back in the fall/winter, sooner if  problems develop.     Pricilla Riffle, MD,  [facility]  Electronically Signed   PVR/MedQ  DD: 01/03/2008  DT: 01/05/2008  Job #: [id number]

## 2011-03-27 ENCOUNTER — Other Ambulatory Visit: Payer: Self-pay | Admitting: Family Medicine

## 2011-03-27 ENCOUNTER — Other Ambulatory Visit: Payer: PRIVATE HEALTH INSURANCE

## 2011-03-27 DIAGNOSIS — E7800 Pure hypercholesterolemia, unspecified: Secondary | ICD-10-CM

## 2011-05-29 ENCOUNTER — Other Ambulatory Visit: Payer: Self-pay | Admitting: *Deleted

## 2011-05-29 MED ORDER — ATORVASTATIN CALCIUM 40 MG PO TABS: 40.0000 mg | ORAL_TABLET | Freq: Every day | ORAL | Status: DC

## 2011-08-01 LAB — BASIC METABOLIC PANEL
BUN: 10
BUN: 16
CO2: 25
CO2: 26
Calcium: 8.6
Chloride: 107
Creatinine, Ser: 0.97
Creatinine, Ser: 0.99
GFR calc Af Amer: >60
GFR calc non Af Amer: >60
Glucose, Bld: 91
Potassium: 4.2
Sodium: 136
Sodium: 140

## 2011-08-01 LAB — DIFFERENTIAL
Basophils Absolute: 0
Basophils Relative: 1
Eosinophils Relative: 2
Lymphocytes Relative: 29
Lymphs Abs: 1.5
Monocytes Absolute: 0.3
Neutrophils Relative %: 62

## 2011-08-01 LAB — POCT CARDIAC MARKERS
CKMB, poc: <1 — ABNORMAL LOW
Myoglobin, poc: 64.5
Operator id: 198171
Troponin i, poc: <0.05

## 2011-08-01 LAB — CBC
HCT: 36.8 — ABNORMAL LOW
Hemoglobin: 12.7 — ABNORMAL LOW
Hemoglobin: 13.8
MCHC: 34.3
MCHC: 34.5
MCV: 93.4
MCV: 93.6
Platelets: 163
Platelets: 181
RBC: 3.93 — ABNORMAL LOW
RBC: 3.94 — ABNORMAL LOW
RDW: 13.7
RDW: 13.8
WBC: 4.9

## 2011-08-01 LAB — COMPREHENSIVE METABOLIC PANEL
AST: 28
Albumin: 4
Alkaline Phosphatase: 48
Calcium: 9.3
Creatinine, Ser: 0.82
GFR calc non Af Amer: >60
Glucose, Bld: 88
Potassium: 3.6
Total Bilirubin: 1.1

## 2011-08-01 LAB — CK TOTAL AND CKMB (NOT AT ARMC)
CK, MB: 1.6
CK, MB: 2.6
Relative Index: 1.5
Relative Index: INVALID
Total CK: 168
Total CK: 95

## 2011-08-01 LAB — LIPID PANEL
Cholesterol: 228 — ABNORMAL HIGH
LDL Cholesterol: 131 — ABNORMAL HIGH
VLDL: 6

## 2011-08-01 LAB — TROPONIN I: Troponin I: 0.06

## 2011-10-22 ENCOUNTER — Other Ambulatory Visit: Payer: Self-pay | Admitting: Family Medicine

## 2011-10-23 ENCOUNTER — Telehealth: Payer: Self-pay | Admitting: Internal Medicine

## 2011-10-23 NOTE — Telephone Encounter (Signed)
Pt wants refill atorvastatin Please call to [facility] [address] Please let him know

## 2011-10-25 MED ORDER — ATORVASTATIN CALCIUM 40 MG PO TABS: 40.0000 mg | ORAL_TABLET | Freq: Every day | ORAL | Status: DC

## 2011-11-29 ENCOUNTER — Other Ambulatory Visit (INDEPENDENT_AMBULATORY_CARE_PROVIDER_SITE_OTHER): Payer: PRIVATE HEALTH INSURANCE

## 2011-11-29 DIAGNOSIS — Z0000 Encounter for general adult medical examination without abnormal findings: Secondary | ICD-10-CM

## 2011-11-29 LAB — CBC WITH DIFFERENTIAL/PLATELET
Basophils Absolute: 0 10*3/uL (ref 0.0–0.1)
Eosinophils Relative: 5 % (ref 0.0–5.0)
Hemoglobin: 13.9 g/dL (ref 13.0–17.0)
Lymphocytes Relative: 45.7 % (ref 12.0–46.0)
Monocytes Relative: 7.3 % (ref 3.0–12.0)
Neutro Abs: 2.3 10*3/uL (ref 1.4–7.7)
Platelets: 174 10*3/uL (ref 150.0–400.0)
RDW: 14.2 % (ref 11.5–14.6)
WBC: 5.5 10*3/uL (ref 4.5–10.5)

## 2011-11-29 LAB — HEPATIC FUNCTION PANEL
ALT: 28 U/L (ref 0–53)
AST: 29 U/L (ref 0–37)
Alkaline Phosphatase: 51 U/L (ref 39–117)
Total Bilirubin: 1 mg/dL (ref 0.3–1.2)

## 2011-11-29 LAB — POCT URINALYSIS DIPSTICK
Leukocytes, UA: NEGATIVE
Nitrite, UA: NEGATIVE
Protein, UA: NEGATIVE
Urobilinogen, UA: 0.2

## 2011-11-29 LAB — BASIC METABOLIC PANEL
Calcium: 9.7 mg/dL (ref 8.4–10.5)
GFR: 117.77 mL/min (ref >60.00)
Glucose, Bld: 95 mg/dL (ref 70–99)
Potassium: 4.7 mEq/L (ref 3.5–5.1)
Sodium: 140 mEq/L (ref 135–145)

## 2011-11-29 LAB — PSA: PSA: 0.93 ng/mL (ref 0.10–4.00)

## 2011-11-29 LAB — LIPID PANEL
LDL Cholesterol: 83 mg/dL (ref 0–99)
VLDL: 6.4 mg/dL (ref 0.0–40.0)

## 2011-12-07 ENCOUNTER — Encounter: Payer: Self-pay | Admitting: Family Medicine

## 2011-12-08 ENCOUNTER — Ambulatory Visit (INDEPENDENT_AMBULATORY_CARE_PROVIDER_SITE_OTHER): Payer: PRIVATE HEALTH INSURANCE | Admitting: Family Medicine

## 2011-12-08 ENCOUNTER — Encounter: Payer: Self-pay | Admitting: Family Medicine

## 2011-12-08 VITALS — BP 132/78 | HR 72 | Temp 98.3°F | Resp 12 | Ht 69.75 in | Wt 176.0 lb

## 2011-12-08 DIAGNOSIS — Z0000 Encounter for general adult medical examination without abnormal findings: Secondary | ICD-10-CM

## 2011-12-08 NOTE — Progress Notes (Signed)
  Subjective:    Patient ID: [name], male    DOB: 1951-05-27, 61 y.o.   MRN: [id number]  HPI  Patient seen for complete physical. History of CAD, hyperlipidemia and prior history of elevated blood pressure (currently stable off medication). He is exercising regularly. No recent chest pain. Patient reduced Lipitor himself recently to 20 mg daily. He was not having any myalgia or other side effect on 40 mg dose.  He quit smoking couple years ago. Last colonoscopy 2004. Positive family history of colon cancer brother early 32s. Patient has not had previous shingles vaccine. Has had Pneumovax.  Past Medical History  Diagnosis Date  . CAD 01/15/2009  . DYSLIPIDEMIA 01/15/2009  . HYPERTENSION, UNSPECIFIED 01/15/2009  . Idiopathic urticaria 07/09/2009   No past surgical history on file.  reports that he quit smoking about 3 years ago. He does not have any smokeless tobacco history on file. He reports that he drinks alcohol. He reports that he does not use illicit drugs. family history includes COPD in his mother; Cancer (age of onset:51) in his sister; Cancer (age of onset:52) in his brother; and Heart disease in his father. Allergies  Allergen Reactions  . YQI:HKVQQVZDGLO+VFIEPPIRJ+JOACZYSAYT Acid+Aspartame     Hives???      Review of Systems  Constitutional: Negative for fever, activity change, appetite change and fatigue.  HENT: Negative for ear pain, congestion and trouble swallowing.   Eyes: Negative for pain and visual disturbance.  Respiratory: Negative for cough, shortness of breath and wheezing.   Cardiovascular: Negative for chest pain and palpitations.  Gastrointestinal: Negative for nausea, vomiting, abdominal pain, diarrhea, constipation, blood in stool, abdominal distention and rectal pain.  Genitourinary: Negative for dysuria, hematuria and testicular pain.  Musculoskeletal: Negative for joint swelling and arthralgias.  Skin: Negative for rash.  Neurological:  Negative for dizziness, syncope and headaches.  Hematological: Negative for adenopathy.  Psychiatric/Behavioral: Negative for confusion and dysphoric mood.       Objective:   Physical Exam  Constitutional: He is oriented to person, place, and time. He appears well-developed and well-nourished. No distress.  HENT:  Head: Normocephalic and atraumatic.  Right Ear: External ear normal.  Left Ear: External ear normal.  Mouth/Throat: Oropharynx is clear and moist.  Eyes: Conjunctivae and EOM are normal. Pupils are equal, round, and reactive to light.  Neck: Normal range of motion. Neck supple. No thyromegaly present.  Cardiovascular: Normal rate, regular rhythm and normal heart sounds.   No murmur heard. Pulmonary/Chest: No respiratory distress. He has no wheezes. He has no rales.  Abdominal: Soft. Bowel sounds are normal. He exhibits no distension and no mass. There is no tenderness. There is no rebound and no guarding.  Musculoskeletal: He exhibits no edema.  Lymphadenopathy:    He has no cervical adenopathy.  Neurological: He is alert and oriented to person, place, and time. He displays normal reflexes. No cranial nerve deficit.  Skin: No rash noted.  Psychiatric: He has a normal mood and affect.          Assessment & Plan:  #1 health maintenance. Continue regular exercise. Check on insurance coverage for shingles vaccine. Reminder for yearly flu vaccine. Pneumovax booster age 46 #2 positive family history of colon cancer a brother. Almost 10 years out from previous colonoscopy. Schedule followup with gastroenterology

## 2011-12-08 NOTE — Patient Instructions (Signed)
Check on insurance coverage for shingles vaccine (Zostavax) Continue with regular exercise. We will call you regarding GI appt.

## 2011-12-11 ENCOUNTER — Ambulatory Visit (INDEPENDENT_AMBULATORY_CARE_PROVIDER_SITE_OTHER): Payer: PRIVATE HEALTH INSURANCE | Admitting: Family Medicine

## 2011-12-11 DIAGNOSIS — Z299 Encounter for prophylactic measures, unspecified: Secondary | ICD-10-CM

## 2011-12-11 DIAGNOSIS — Z23 Encounter for immunization: Secondary | ICD-10-CM

## 2011-12-11 MED ORDER — ZOSTER VACCINE LIVE 19400 UNT/0.65ML ~~LOC~~ SOLR: 0.6500 mL | Freq: Once | SUBCUTANEOUS | Status: DC

## 2011-12-12 ENCOUNTER — Ambulatory Visit: Payer: PRIVATE HEALTH INSURANCE | Admitting: Family Medicine

## 2011-12-14 ENCOUNTER — Encounter: Payer: Self-pay | Admitting: Internal Medicine

## 2011-12-29 ENCOUNTER — Ambulatory Visit (INDEPENDENT_AMBULATORY_CARE_PROVIDER_SITE_OTHER): Payer: PRIVATE HEALTH INSURANCE | Admitting: Internal Medicine

## 2011-12-29 ENCOUNTER — Encounter: Payer: Self-pay | Admitting: Internal Medicine

## 2011-12-29 VITALS — BP 125/75 | HR 75 | Ht 70.0 in | Wt 181.0 lb

## 2011-12-29 DIAGNOSIS — I2510 Atherosclerotic heart disease of native coronary artery without angina pectoris: Secondary | ICD-10-CM

## 2011-12-29 DIAGNOSIS — E785 Hyperlipidemia, unspecified: Secondary | ICD-10-CM

## 2011-12-29 DIAGNOSIS — I10 Essential (primary) hypertension: Secondary | ICD-10-CM

## 2011-12-29 NOTE — Patient Instructions (Signed)
Your physician wants you to follow-up in: YEAR WITH DR NISHAN  You will receive a reminder letter in the mail two months in advance. If you don't receive a letter, please call our office to schedule the follow-up appointment.  Your physician recommends that you continue on your current medications as directed. Please refer to the Current Medication list given to you today. 

## 2011-12-29 NOTE — Assessment & Plan Note (Signed)
Patient is doing well.  Active.  No symptoms concerning for angina.  Continue current regimen.

## 2011-12-29 NOTE — Assessment & Plan Note (Signed)
BP is good

## 2011-12-29 NOTE — Assessment & Plan Note (Signed)
Patient has switched to lipitor 20.  Trying to minimize doses.  I have asked him to take 40 3x per week, 20 other days.  Unfort his insurance will not cover lipomed.

## 2011-12-29 NOTE — Progress Notes (Signed)
HPI [name] is a 61 year old with a history of CAD (s/p PTCA/stent to the LAD in 2008.  I last saw him in sping 2011. Since see he has been followed by B. Burchette.\ Lipid panel in Feburary LDL was 83, HDL was 89.   Since seen he has done well.  No Chest pressure.  No SOB.  Active.  Uses lifecycle 1 hour per day.   Allergies  Allergen Reactions  . BJY:NWGNFAOZHYQ+MVHQIONGE+XBMWUXLKGM Acid+Aspartame     Hives???    Current Outpatient Prescriptions  Medication Sig Dispense Refill  . aspirin 81 MG tablet Take 81 mg by mouth daily.      Marland Kitchen atorvastatin (LIPITOR) 40 MG tablet Take 1/2 tab daily      . desoximetasone (TOPICORT) 0.25 % cream APPLY TO EFFECTED AREA AS NEEDED  60 g  0  . Travoprost, BAK Free, (TRAVATAN) 0.004 % SOLN ophthalmic solution Place 1 drop into both eyes at bedtime.       Current Facility-Administered Medications  Medication Dose Route Frequency Provider Last Rate Last Dose  . zoster vaccine live (PF) (ZOSTAVAX) injection 19,400 Units  0.65 mL Subcutaneous Once Kristian Covey, MD        Past Medical History  Diagnosis Date  . CAD 01/15/2009  . DYSLIPIDEMIA 01/15/2009  . HYPERTENSION, UNSPECIFIED 01/15/2009  . Idiopathic urticaria 07/09/2009    No past surgical history on file.  Family History  Problem Relation Age of Onset  . COPD Mother   . Heart disease Father   . Cancer Sister 16    ?lung cancer  . Cancer Brother 70    colon cancer    History   Social History  . Marital Status: Single    Spouse Name: N/A    Number of Children: N/A  . Years of Education: N/A   Occupational History  . Not on file.   Social History Main Topics  . Smoking status: Former Smoker    Quit date: 03/23/2008  . Smokeless tobacco: Not on file  . Alcohol Use: Yes  . Drug Use: No  . Sexually Active: Not on file   Other Topics Concern  . Not on file   Social History Narrative  . No narrative on file    Review of Systems:  All systems reviewed.  They are negative  to the above problem except as previously stated.  Vital Signs: BP 125/75  Pulse 75  Ht 5\' 10"  (1.778 m)  Wt 181 lb (82.101 kg)  BMI 25.97 kg/m2  Physical Exam Patient is in NAD HEENT:  Normocephalic, atraumatic. EOMI, PERRLA.  Neck: JVP is normal. No thyromegaly. No bruits.  Lungs: clear to auscultation. No rales no wheezes.  Heart: Regular rate and rhythm. Normal S1, S2. No S3.   No significant murmurs. PMI not displaced.  Abdomen:  Supple, nontender. Normal bowel sounds. No masses. No hepatomegaly.  Extremities:   Good distal pulses throughout. No lower extremity edema.  Musculoskeletal :moving all extremities.  Neuro:   alert and oriented x3.  CN II-XII grossly intact.  EKG:  SR.  78 bpm.  SL sagging of ST segments III, F.    Assessment and Plan:

## 2012-01-26 ENCOUNTER — Ambulatory Visit (AMBULATORY_SURGERY_CENTER): Payer: PRIVATE HEALTH INSURANCE | Admitting: *Deleted

## 2012-01-26 VITALS — Ht 70.0 in | Wt 180.0 lb

## 2012-01-26 DIAGNOSIS — Z1211 Encounter for screening for malignant neoplasm of colon: Secondary | ICD-10-CM

## 2012-01-26 MED ORDER — PEG-KCL-NACL-NASULF-NA ASC-C 100 G PO SOLR: ORAL | Status: DC

## 2012-02-06 ENCOUNTER — Encounter: Payer: Self-pay | Admitting: Family Medicine

## 2012-02-06 ENCOUNTER — Ambulatory Visit (INDEPENDENT_AMBULATORY_CARE_PROVIDER_SITE_OTHER): Payer: PRIVATE HEALTH INSURANCE | Admitting: Family Medicine

## 2012-02-06 VITALS — BP 130/80 | Temp 97.7°F | Wt 180.0 lb

## 2012-02-06 DIAGNOSIS — M79675 Pain in left toe(s): Secondary | ICD-10-CM

## 2012-02-06 DIAGNOSIS — M79609 Pain in unspecified limb: Secondary | ICD-10-CM

## 2012-02-06 NOTE — Progress Notes (Signed)
  Subjective:    Patient ID: [name], male    DOB: [birth year], 61 y.o.   MRN: [id number]  HPI  Patient seen with left fifth toe injury. He hit his toe against a bedpost yesterday. Had probable dislocation his toe was angulated he pulled this out and moved over back into place. Severe pain yesterday but lessened today. Able to ambulate without much difficulty. He has some bruising and swelling. Denies any other bony or foot injury. Minimal pain left fifth distal metatarsal.   Review of Systems     Objective:   Physical Exam  Constitutional: He appears well-developed and well-nourished.  Cardiovascular: Normal rate and regular rhythm.   Musculoskeletal:       Patient has some mild ecchymosis left fifth toe. Mild edema and localized tenderness. No evidence for dislocation this time          Assessment & Plan:  Left fifth toe injury. By history, probable dislocation yesterday relocated by patient. Good chance of small fifth toe fracture. Offered x-rays of this point he wishes to wait since pain is much better today and would not likely change our therapy.   To wear comfortable shoes

## 2012-02-09 ENCOUNTER — Ambulatory Visit (AMBULATORY_SURGERY_CENTER): Payer: PRIVATE HEALTH INSURANCE | Admitting: Internal Medicine

## 2012-02-09 ENCOUNTER — Encounter: Payer: Self-pay | Admitting: Internal Medicine

## 2012-02-09 VITALS — BP 144/83 | HR 71 | Temp 96.6°F | Resp 20 | Ht 70.0 in | Wt 180.0 lb

## 2012-02-09 DIAGNOSIS — Z1211 Encounter for screening for malignant neoplasm of colon: Secondary | ICD-10-CM

## 2012-02-09 MED ORDER — SODIUM CHLORIDE 0.9 % IV SOLN: 500.0000 mL | INTRAVENOUS | Status: DC

## 2012-02-09 NOTE — Op Note (Signed)
[facility] [address] [place], [place]  [address]  COLONOSCOPY PROCEDURE REPORT  PATIENT:  [name], [name]  MR#:  [id number] BIRTHDATE:  15-Jun-1951, 60 yrs. old  GENDER:  male ENDOSCOPIST:  Hedwig Morton. Juanda Chance, MD REF. BY:  Evelena Peat, M.D. PROCEDURE DATE:  02/09/2012 PROCEDURE:  Colonoscopy 08657 ASA CLASS:  Class II INDICATIONS:  family history of colon cancer brother with colon cancer prior colon 1996 [place] MEDICATIONS:   MAC sedation, administered by CRNA, propofol (Diprivan) 400 mg  DESCRIPTION OF PROCEDURE:   After the risks and benefits and of the procedure were explained, informed consent was obtained. Digital rectal exam was performed and revealed no rectal masses. The LB [id number]-H180AL [id number] endoscope was introduced through the anus and advanced to the cecum, which was identified by both the appendix and ileocecal valve.  The quality of the prep was good, using MoviPrep.  The instrument was then slowly withdrawn as the colon was fully examined. <<PROCEDUREIMAGES>>  FINDINGS:  Mild diverticulosis was found in the sigmoid colon (see image1 and image7).  This was otherwise a normal examination of the colon (see image8, image6, image5, image3, and image4). Retroflexed views in the rectum revealed no abnormalities.    The scope was then withdrawn from the patient and the procedure completed.  COMPLICATIONS:  None ENDOSCOPIC IMPRESSION: 1) Mild diverticulosis in the sigmoid colon 2) Otherwise normal examination RECOMMENDATIONS: 1) High fiber diet.  REPEAT EXAM:  In 5 year(s) for.  ______________________________ Hedwig Morton. Juanda Chance, MD  CC:  n. eSIGNED:   Hedwig Morton. Vanessia Bokhari at 02/09/2012 09:26 AM  Kate Sable, [phone]

## 2012-02-09 NOTE — Patient Instructions (Signed)

## 2012-02-12 ENCOUNTER — Telehealth: Payer: Self-pay | Admitting: *Deleted

## 2012-02-12 NOTE — Telephone Encounter (Signed)
MESSAGE LEFT FOR THE PATIENT. 

## 2012-04-09 ENCOUNTER — Other Ambulatory Visit: Payer: Self-pay | Admitting: *Deleted

## 2012-04-09 MED ORDER — DESOXIMETASONE 0.25 % EX CREA: TOPICAL_CREAM | CUTANEOUS | Status: DC

## 2012-04-11 ENCOUNTER — Other Ambulatory Visit: Payer: Self-pay

## 2012-04-11 MED ORDER — ATORVASTATIN CALCIUM 40 MG PO TABS: 40.0000 mg | ORAL_TABLET | Freq: Once | ORAL | Status: DC

## 2012-09-23 ENCOUNTER — Other Ambulatory Visit: Payer: Self-pay | Admitting: Internal Medicine

## 2012-10-19 ENCOUNTER — Other Ambulatory Visit: Payer: Self-pay | Admitting: Internal Medicine

## 2012-10-23 NOTE — Telephone Encounter (Signed)
Refill OK

## 2013-01-24 ENCOUNTER — Other Ambulatory Visit (INDEPENDENT_AMBULATORY_CARE_PROVIDER_SITE_OTHER): Payer: PRIVATE HEALTH INSURANCE

## 2013-01-24 DIAGNOSIS — Z0000 Encounter for general adult medical examination without abnormal findings: Secondary | ICD-10-CM

## 2013-01-24 LAB — POCT URINALYSIS DIPSTICK
Glucose, UA: NEGATIVE
Ketones, UA: NEGATIVE
Leukocytes, UA: NEGATIVE
Protein, UA: NEGATIVE
Spec Grav, UA: <=1.005
Urobilinogen, UA: 0.2

## 2013-01-24 LAB — HEPATIC FUNCTION PANEL
Albumin: 4.2 g/dL (ref 3.5–5.2)
Alkaline Phosphatase: 55 U/L (ref 39–117)
Bilirubin, Direct: 0.2 mg/dL (ref 0.0–0.3)
Total Protein: 7.1 g/dL (ref 6.0–8.3)

## 2013-01-24 LAB — CBC WITH DIFFERENTIAL/PLATELET
Basophils Relative: 0.8 % (ref 0.0–3.0)
Eosinophils Absolute: 0.2 10*3/uL (ref 0.0–0.7)
Eosinophils Relative: 2.9 % (ref 0.0–5.0)
HCT: 40.6 % (ref 39.0–52.0)
Hemoglobin: 13.8 g/dL (ref 13.0–17.0)
Lymphs Abs: 2.3 10*3/uL (ref 0.7–4.0)
MCHC: 33.9 g/dL (ref 30.0–36.0)
MCV: 90.4 fl (ref 78.0–100.0)
Monocytes Absolute: 0.4 10*3/uL (ref 0.1–1.0)
Neutro Abs: 3.4 10*3/uL (ref 1.4–7.7)
RBC: 4.49 Mil/uL (ref 4.22–5.81)

## 2013-01-24 LAB — PSA: PSA: 0.97 ng/mL (ref 0.10–4.00)

## 2013-01-24 LAB — BASIC METABOLIC PANEL
BUN: 9 mg/dL (ref 6–23)
Creatinine, Ser: 0.8 mg/dL (ref 0.4–1.5)
GFR: 108.58 mL/min (ref >60.00)
Glucose, Bld: 94 mg/dL (ref 70–99)

## 2013-01-24 LAB — LIPID PANEL: Cholesterol: 142 mg/dL (ref 0–200)

## 2013-01-24 LAB — TSH: TSH: 0.87 u[IU]/mL (ref 0.35–5.50)

## 2013-01-31 ENCOUNTER — Encounter: Payer: Self-pay | Admitting: Family Medicine

## 2013-01-31 ENCOUNTER — Ambulatory Visit (INDEPENDENT_AMBULATORY_CARE_PROVIDER_SITE_OTHER): Payer: PRIVATE HEALTH INSURANCE | Admitting: Family Medicine

## 2013-01-31 VITALS — BP 140/72 | HR 72 | Temp 98.2°F | Resp 12 | Ht 69.75 in | Wt 187.0 lb

## 2013-01-31 DIAGNOSIS — Z0000 Encounter for general adult medical examination without abnormal findings: Secondary | ICD-10-CM

## 2013-01-31 NOTE — Progress Notes (Signed)
  Subjective:    Patient ID: [name], male    DOB: 27-Dec-1950, 62 y.o.   MRN: [id number]  HPI Patient is here for complete physical He has history of coronary disease with stent placement 2008 No recent chest pains. He does not exercise much at all. Had some recent low back pains. He does remain compliant with Lipitor and aspirin therapy. Blood pressure is very well controlled by home readings.  Tetanus up-to-date. Colonoscopy last year.  Past Medical History  Diagnosis Date  . CAD 01/15/2009  . DYSLIPIDEMIA 01/15/2009  . HYPERTENSION, UNSPECIFIED 01/15/2009  . Idiopathic urticaria 07/09/2009  . Arthritis   . Blood transfusion 1953  . Glaucoma     ocular hypertention   Past Surgical History  Procedure Laterality Date  . Coronary stent placement  2008  . Wrist fusion  12/2009    left  . Anal fistulectomy  2004    reports that he quit smoking about 4 years ago. He has never used smokeless tobacco. He reports that  drinks alcohol. He reports that he does not use illicit drugs. family history includes COPD in his mother; Cancer (age of onset: 26) in his sister; Cancer (age of onset: 80) in his brother; Colon cancer in his brother; Colon polyps in his sister; and Heart disease in his father. Allergies  Allergen Reactions  . Amoxicillin-Pot Clavulanate     Hives??? Augmentin  . Codeine     Hives???      Review of Systems  Constitutional: Negative for fever, activity change, appetite change and fatigue.  HENT: Negative for ear pain, congestion and trouble swallowing.   Eyes: Negative for pain and visual disturbance.  Respiratory: Negative for cough, shortness of breath and wheezing.   Cardiovascular: Negative for chest pain and palpitations.  Gastrointestinal: Negative for nausea, vomiting, abdominal pain, diarrhea, constipation, blood in stool, abdominal distention and rectal pain.  Genitourinary: Negative for dysuria, hematuria and testicular pain.  Musculoskeletal:  Negative for joint swelling and arthralgias.  Skin: Negative for rash.  Neurological: Negative for dizziness, syncope and headaches.  Hematological: Negative for adenopathy.  Psychiatric/Behavioral: Negative for confusion and dysphoric mood.       Objective:   Physical Exam  Constitutional: He is oriented to person, place, and time. He appears well-developed and well-nourished. No distress.  HENT:  Head: Normocephalic and atraumatic.  Right Ear: External ear normal.  Left Ear: External ear normal.  Mouth/Throat: Oropharynx is clear and moist.  Eyes: Conjunctivae and EOM are normal. Pupils are equal, round, and reactive to light.  Neck: Normal range of motion. Neck supple. No thyromegaly present.  Cardiovascular: Normal rate, regular rhythm and normal heart sounds.   No murmur heard. Pulmonary/Chest: No respiratory distress. He has no wheezes. He has no rales.  Abdominal: Soft. Bowel sounds are normal. He exhibits no distension and no mass. There is no tenderness. There is no rebound and no guarding.  Musculoskeletal: He exhibits no edema.  Lymphadenopathy:    He has no cervical adenopathy.  Neurological: He is alert and oriented to person, place, and time. He displays normal reflexes. No cranial nerve deficit.  Skin: No rash noted.  Psychiatric: He has a normal mood and affect.          Assessment & Plan:  Complete physical. Labs reviewed. No significant concerns. Establish more consistent exercise. Continue aspirin and Lipitor. Continue followup with cardiologist. Immunizations up-to-date. Pneumovax booster at age 2. Continue yearly flu vaccine

## 2013-01-31 NOTE — Patient Instructions (Addendum)
Try to establish more consistent exercise. 

## 2013-02-17 ENCOUNTER — Encounter: Payer: Self-pay | Admitting: Nurse Practitioner

## 2013-02-17 ENCOUNTER — Ambulatory Visit (INDEPENDENT_AMBULATORY_CARE_PROVIDER_SITE_OTHER): Payer: PRIVATE HEALTH INSURANCE | Admitting: Nurse Practitioner

## 2013-02-17 VITALS — BP 144/86 | HR 58 | Ht 70.0 in | Wt 189.0 lb

## 2013-02-17 DIAGNOSIS — I2510 Atherosclerotic heart disease of native coronary artery without angina pectoris: Secondary | ICD-10-CM

## 2013-02-17 MED ORDER — ATORVASTATIN CALCIUM 40 MG PO TABS: 40.0000 mg | ORAL_TABLET | Freq: Every day | ORAL | Status: DC

## 2013-02-17 NOTE — Patient Instructions (Signed)
Call in February for an April visit with Dr. Tenny Craw  Stay on your current medicines  I have refilled your Lipitor today  Call the [facility] office at [phone] if you have any questions, problems or concerns.

## 2013-02-17 NOTE — Progress Notes (Signed)
[name] Date of Birth: 29-Aug-1951 Medical Record #[id number]  History of Present Illness: [name] is seen back today for a follow up visit. He is seen for [name]. Has known CAD with past PTCA/stent to the LAD in 2008 and HLD. Last seen in March of last year.   Comes back today. Doing well. No chest pain. Not short of breath. Exercising less due to low back issues. Checks his blood pressure about once a month at the [facility] and it is good. Feels ok on his medicines. Has had recent labs that look good. Overall, he feels well.   Current Outpatient Prescriptions on File Prior to Visit  Medication Sig Dispense Refill  . ALPHA LIPOIC ACID PO Take 100 mg by mouth daily.      Marland Kitchen aspirin 81 MG tablet Take 81 mg by mouth daily.      Marland Kitchen desoximetasone (TOPICORT) 0.25 % cream APPLY TO AFFECTED AREA AS NEEDED  60 g  0  . fish oil-omega-3 fatty acids 1000 MG capsule Take 1 g by mouth daily.      Marland Kitchen latanoprost (XALATAN) 0.005 % ophthalmic solution Place 1 drop into both eyes. Per opthalmologist      . Multiple Vitamins-Minerals (MULTIVITAMIN WITH MINERALS) tablet Take 1 tablet by mouth daily.      . vitamin D, CHOLECALCIFEROL, 400 UNITS tablet Take 400 Units by mouth 2 (two) times daily.      . vitamin E 200 UNIT capsule Take 200 Units by mouth daily.       No current facility-administered medications on file prior to visit.    Allergies  Allergen Reactions  . Amoxicillin-Pot Clavulanate     Hives??? Augmentin  . Codeine     Hives???    Past Medical History  Diagnosis Date  . CAD 01/15/2009  . DYSLIPIDEMIA 01/15/2009  . HYPERTENSION, UNSPECIFIED 01/15/2009  . Idiopathic urticaria 07/09/2009  . Arthritis   . Blood transfusion 1953  . Glaucoma     ocular hypertention    Past Surgical History  Procedure Laterality Date  . Coronary stent placement  2008  . Wrist fusion  12/2009    left  . Anal fistulectomy  2004    History  Smoking status  . Former Smoker  . Quit date:  03/23/2008  Smokeless tobacco  . Never Used    History  Alcohol Use  . Yes    Comment: 1-2 drinks a year    Family History  Problem Relation Age of Onset  . COPD Mother   . Heart disease Father   . Cancer Sister 63    ?lung cancer  . Colon polyps Sister   . Cancer Brother 47    colon cancer  . Colon cancer Brother     Review of Systems: The review of systems is per the HPI.  All other systems were reviewed and are negative.  Physical Exam: BP 144/86  Pulse 58  Ht 5\' 10"  (1.778 m)  Wt 189 lb (85.73 kg)  BMI 27.12 kg/m2 Patient is very pleasant and in no acute distress. Skin is warm and dry. Color is normal.  HEENT is unremarkable. Normocephalic/atraumatic. PERRL. Sclera are nonicteric. Neck is supple. No masses. No JVD. Lungs are clear. Cardiac exam shows a regular rate and rhythm. Abdomen is soft. Extremities are without edema. Gait and ROM are intact. No gross neurologic deficits noted.  LABORATORY DATA:  Lab Results  Component Value Date   WBC 6.2 01/24/2013  HGB 13.8 01/24/2013   HCT 40.6 01/24/2013   PLT 205.0 01/24/2013   GLUCOSE 94 01/24/2013   CHOL 142 01/24/2013   TRIG 77.0 01/24/2013   HDL 52.70 01/24/2013   LDLDIRECT 132.7 02/03/2009   LDLCALC 74 01/24/2013   ALT 22 01/24/2013   AST 23 01/24/2013   NA 138 01/24/2013   K 3.9 01/24/2013   CL 105 01/24/2013   CREATININE 0.8 01/24/2013   BUN 9 01/24/2013   CO2 23 01/24/2013   TSH 0.87 01/24/2013   PSA 0.97 01/24/2013   INR 0.9 09/16/2007     Assessment / Plan: 1. CAD - remote stent - doing well clinically.   2. HLD - Lipitor refilled today  3. Elevated BP - he is asked to monitor at home. Goal is less than 135/85  Tentatively see him back in a year.   Patient is agreeable to this plan and will call if any problems develop in the interim.   Billy Macadamia, RN, ANP-C [facility] [address] [place], [place]  [address]

## 2014-02-04 ENCOUNTER — Other Ambulatory Visit (INDEPENDENT_AMBULATORY_CARE_PROVIDER_SITE_OTHER): Payer: PRIVATE HEALTH INSURANCE

## 2014-02-04 DIAGNOSIS — Z0000 Encounter for general adult medical examination without abnormal findings: Secondary | ICD-10-CM

## 2014-02-04 LAB — CBC WITH DIFFERENTIAL/PLATELET
Basophils Absolute: 0.1 10*3/uL (ref 0.0–0.1)
Basophils Relative: 1.2 % (ref 0.0–3.0)
EOS ABS: 0.1 10*3/uL (ref 0.0–0.7)
Eosinophils Relative: 2.6 % (ref 0.0–5.0)
HCT: 40.2 % (ref 39.0–52.0)
Hemoglobin: 13.7 g/dL (ref 13.0–17.0)
LYMPHS PCT: 44.4 % (ref 12.0–46.0)
Lymphs Abs: 2.2 10*3/uL (ref 0.7–4.0)
MCHC: 34 g/dL (ref 30.0–36.0)
MCV: 90.4 fl (ref 78.0–100.0)
MONO ABS: 0.3 10*3/uL (ref 0.1–1.0)
Monocytes Relative: 6.5 % (ref 3.0–12.0)
Neutro Abs: 2.3 10*3/uL (ref 1.4–7.7)
Neutrophils Relative %: 45.3 % (ref 43.0–77.0)
PLATELETS: 229 10*3/uL (ref 150.0–400.0)
RBC: 4.44 Mil/uL (ref 4.22–5.81)
RDW: 13.5 % (ref 11.5–14.6)
WBC: 5 10*3/uL (ref 4.5–10.5)

## 2014-02-04 LAB — BASIC METABOLIC PANEL
BUN: 13 mg/dL (ref 6–23)
CHLORIDE: 105 meq/L (ref 96–112)
CO2: 24 mEq/L (ref 19–32)
Calcium: 9.4 mg/dL (ref 8.4–10.5)
Creatinine, Ser: 0.8 mg/dL (ref 0.4–1.5)
GFR: 99.24 mL/min (ref >60.00)
Glucose, Bld: 87 mg/dL (ref 70–99)
Potassium: 4.1 mEq/L (ref 3.5–5.1)
SODIUM: 137 meq/L (ref 135–145)

## 2014-02-04 LAB — HEPATIC FUNCTION PANEL
ALK PHOS: 60 U/L (ref 39–117)
ALT: 21 U/L (ref 0–53)
AST: 25 U/L (ref 0–37)
Albumin: 4.1 g/dL (ref 3.5–5.2)
Bilirubin, Direct: 0.2 mg/dL (ref 0.0–0.3)
TOTAL PROTEIN: 7.2 g/dL (ref 6.0–8.3)
Total Bilirubin: 1.2 mg/dL (ref 0.3–1.2)

## 2014-02-04 LAB — POCT URINALYSIS DIPSTICK
Bilirubin, UA: NEGATIVE
Blood, UA: NEGATIVE
Glucose, UA: NEGATIVE
KETONES UA: NEGATIVE
Leukocytes, UA: NEGATIVE
Nitrite, UA: NEGATIVE
PH UA: 6
PROTEIN UA: NEGATIVE
SPEC GRAV UA: 1.015
UROBILINOGEN UA: 0.2

## 2014-02-04 LAB — LIPID PANEL
CHOLESTEROL: 156 mg/dL (ref 0–200)
HDL: 60.7 mg/dL (ref >39.00)
LDL CALC: 89 mg/dL (ref 0–99)
Total CHOL/HDL Ratio: 3
Triglycerides: 33 mg/dL (ref 0.0–149.0)
VLDL: 6.6 mg/dL (ref 0.0–40.0)

## 2014-02-04 LAB — PSA: PSA: 1 ng/mL (ref 0.10–4.00)

## 2014-02-04 LAB — TSH: TSH: 1.35 u[IU]/mL (ref 0.35–5.50)

## 2014-02-13 ENCOUNTER — Encounter: Payer: Self-pay | Admitting: Family Medicine

## 2014-02-13 ENCOUNTER — Ambulatory Visit (INDEPENDENT_AMBULATORY_CARE_PROVIDER_SITE_OTHER): Payer: PRIVATE HEALTH INSURANCE | Admitting: Family Medicine

## 2014-02-13 VITALS — BP 130/70 | HR 66 | Temp 97.8°F | Ht 69.75 in | Wt 186.0 lb

## 2014-02-13 DIAGNOSIS — Z0000 Encounter for general adult medical examination without abnormal findings: Secondary | ICD-10-CM

## 2014-02-13 NOTE — Progress Notes (Signed)
Subjective:    Patient ID: [name], male    DOB: [birth year], 63 y.o.   MRN: [id number]  HPI Patient seen for complete physical. He has history of CAD and had LAD stent several years ago. He has done extremely well since that time. He is diligent with exercise. He has hyperlipidemia treated with Lipitor. Basically only takes aspirin and Lipitor regularly. He exercises with cycling about 5 days per week. No recent chest pains. Followup with cardiology in a couple weeks.  Tetanus up-to-date. Colonoscopy up-to-date. Nonsmoker  Past Medical History  Diagnosis Date  . CAD 01/15/2009  . DYSLIPIDEMIA 01/15/2009  . HYPERTENSION, UNSPECIFIED 01/15/2009  . Idiopathic urticaria 07/09/2009  . Arthritis   . Blood transfusion 1953  . Glaucoma     ocular hypertention   Past Surgical History  Procedure Laterality Date  . Coronary stent placement  2008  . Wrist fusion  12/2009    left  . Anal fistulectomy  2004    reports that he quit smoking about 5 years ago. He has never used smokeless tobacco. He reports that he drinks alcohol. He reports that he does not use illicit drugs. family history includes COPD in his mother; Cancer (age of onset: 7351) in his sister; Cancer (age of onset: 6152) in his brother; Colon cancer in his brother; Colon polyps in his sister; Heart disease (age of onset: 6851) in his father. Allergies  Allergen Reactions  . Amoxicillin-Pot Clavulanate     Hives??? Augmentin  . Codeine     Hives???      Review of Systems  Constitutional: Negative for fever, activity change, appetite change, fatigue and unexpected weight change.  HENT: Negative for congestion, ear pain and trouble swallowing.   Eyes: Negative for pain and visual disturbance.  Respiratory: Negative for cough, shortness of breath and wheezing.   Cardiovascular: Negative for chest pain and palpitations.  Gastrointestinal: Negative for nausea, vomiting, abdominal pain, diarrhea, constipation, blood in stool,  abdominal distention and rectal pain.  Endocrine: Negative for polydipsia and polyuria.  Genitourinary: Negative for dysuria, hematuria and testicular pain.  Musculoskeletal: Negative for arthralgias and joint swelling.  Skin: Negative for rash.  Neurological: Negative for dizziness, syncope and headaches.  Hematological: Negative for adenopathy.  Psychiatric/Behavioral: Negative for confusion and dysphoric mood.       Objective:   Physical Exam  Constitutional: He is oriented to person, place, and time. He appears well-developed and well-nourished. No distress.  HENT:  Head: Normocephalic and atraumatic.  Right Ear: External ear normal.  Left Ear: External ear normal.  Mouth/Throat: Oropharynx is clear and moist.  Eyes: Conjunctivae and EOM are normal. Pupils are equal, round, and reactive to light.  Neck: Normal range of motion. Neck supple. No thyromegaly present.  Cardiovascular: Normal rate, regular rhythm and normal heart sounds.   No murmur heard. Pulmonary/Chest: No respiratory distress. He has no wheezes. He has no rales.  Abdominal: Soft. Bowel sounds are normal. He exhibits no distension and no mass. There is no tenderness. There is no rebound and no guarding.  Musculoskeletal: He exhibits no edema.  Lymphadenopathy:    He has no cervical adenopathy.  Neurological: He is alert and oriented to person, place, and time. He displays normal reflexes. No cranial nerve deficit.  Skin: No rash noted.  Psychiatric: He has a normal mood and affect.          Assessment & Plan:  Complete physical. Labs reviewed with patient and all favorable. Colonoscopy up  to date. PSA normal. Tetanus up-to-date. Prevnar 13 at age 63. He's had shingles vaccine already

## 2014-02-13 NOTE — Progress Notes (Signed)
Pre visit review using our clinic review tool, if applicable. No additional management support is needed unless otherwise documented below in the visit note. 

## 2014-02-23 ENCOUNTER — Ambulatory Visit: Payer: PRIVATE HEALTH INSURANCE | Admitting: Internal Medicine

## 2014-02-27 ENCOUNTER — Ambulatory Visit: Payer: PRIVATE HEALTH INSURANCE | Admitting: Internal Medicine

## 2014-03-16 ENCOUNTER — Other Ambulatory Visit: Payer: Self-pay | Admitting: Nurse Practitioner

## 2014-04-28 ENCOUNTER — Telehealth: Payer: Self-pay | Admitting: Family Medicine

## 2014-04-28 MED ORDER — ATORVASTATIN CALCIUM 40 MG PO TABS: ORAL_TABLET | ORAL | Status: DC

## 2014-04-28 NOTE — Telephone Encounter (Signed)
Pt call to ask if Dr Caryl NeverBurchette could start writing him an rx for atorvastatin (LIPITOR) 40 MG tablet  Per his insurance it has to be a 30 day supply   Pharmacy  [facility]

## 2014-04-28 NOTE — Telephone Encounter (Signed)
[facility] has been refilling this medication for him. Pt has not been to [facility] since 01/2013. This is why patient want to start getting refills done here at the office.

## 2014-04-28 NOTE — Telephone Encounter (Signed)
Rx sent to pharmacy   

## 2014-04-28 NOTE — Telephone Encounter (Signed)
Ok to refill for one year  

## 2014-12-18 ENCOUNTER — Ambulatory Visit (INDEPENDENT_AMBULATORY_CARE_PROVIDER_SITE_OTHER): Payer: PRIVATE HEALTH INSURANCE | Admitting: Family Medicine

## 2014-12-18 ENCOUNTER — Encounter: Payer: Self-pay | Admitting: Family Medicine

## 2014-12-18 VITALS — BP 130/80 | HR 60 | Temp 97.3°F | Wt 193.0 lb

## 2014-12-18 DIAGNOSIS — H6123 Impacted cerumen, bilateral: Secondary | ICD-10-CM

## 2014-12-18 NOTE — Progress Notes (Signed)
   Subjective:    Patient ID: [name], male    DOB: 04/27/1951, 64 y.o.   MRN: [id number]  HPI Patient seen for acute visit. Bilateral ear pressure. Right ear greater than left. Noted for the past week. Denies any sinus pressure. He's noted a buzzing type sound in both ears intermittently. No vertigo or dizziness. No ear drainage. No headaches. No fevers or chills.  Past Medical History  Diagnosis Date  . CAD 01/15/2009  . DYSLIPIDEMIA 01/15/2009  . HYPERTENSION, UNSPECIFIED 01/15/2009  . Idiopathic urticaria 07/09/2009  . Arthritis   . Blood transfusion 1953  . Glaucoma     ocular hypertention   Past Surgical History  Procedure Laterality Date  . Coronary stent placement  2008  . Wrist fusion  12/2009    left  . Anal fistulectomy  2004    reports that he quit smoking about 6 years ago. He has never used smokeless tobacco. He reports that he drinks alcohol. He reports that he does not use illicit drugs. family history includes COPD in his mother; Cancer (age of onset: 7151) in his sister; Cancer (age of onset: 5152) in his brother; Colon cancer in his brother; Colon polyps in his sister; Heart disease (age of onset: 6251) in his father. Allergies  Allergen Reactions  . Amoxicillin-Pot Clavulanate     Hives??? Augmentin  . Codeine     Hives???      Review of Systems  Constitutional: Negative for fever and chills.  HENT: Negative for hearing loss and sinus pressure.   Neurological: Negative for headaches.       Objective:   Physical Exam  Constitutional: He appears well-developed and well-nourished.  HENT:  Cerumen impaction bilaterally. Removed left ear canal with curette. Right canal required irrigation  Cardiovascular: Normal rate and regular rhythm.   Pulmonary/Chest: Effort normal and breath sounds normal. No respiratory distress. He has no wheezes. He has no rales.          Assessment & Plan:  Bilateral cerumen impaction.  Left ear canal cleared with  currette.  Right required irrigation and pt able to hear better afterwards.  TMs appear normal.

## 2014-12-18 NOTE — Progress Notes (Signed)
Pre visit review using our clinic review tool, if applicable. No additional management support is needed unless otherwise documented below in the visit note. 

## 2015-06-11 ENCOUNTER — Telehealth: Payer: Self-pay | Admitting: Family Medicine

## 2015-06-11 ENCOUNTER — Other Ambulatory Visit: Payer: Self-pay | Admitting: Family Medicine

## 2015-06-11 DIAGNOSIS — Z0000 Encounter for general adult medical examination without abnormal findings: Secondary | ICD-10-CM

## 2015-06-11 NOTE — Telephone Encounter (Signed)
Labs are ordered 

## 2015-06-11 NOTE — Telephone Encounter (Signed)
Can pt add PSA to his CPX labs next wed?

## 2015-06-16 ENCOUNTER — Other Ambulatory Visit (INDEPENDENT_AMBULATORY_CARE_PROVIDER_SITE_OTHER): Payer: Medicare Other

## 2015-06-16 DIAGNOSIS — E785 Hyperlipidemia, unspecified: Secondary | ICD-10-CM

## 2015-06-16 DIAGNOSIS — I10 Essential (primary) hypertension: Secondary | ICD-10-CM

## 2015-06-16 DIAGNOSIS — Z0000 Encounter for general adult medical examination without abnormal findings: Secondary | ICD-10-CM | POA: Diagnosis not present

## 2015-06-16 LAB — HEPATIC FUNCTION PANEL
ALBUMIN: 4.4 g/dL (ref 3.5–5.2)
ALT: 19 U/L (ref 0–53)
AST: 20 U/L (ref 0–37)
Alkaline Phosphatase: 51 U/L (ref 39–117)
Bilirubin, Direct: 0.2 mg/dL (ref 0.0–0.3)
TOTAL PROTEIN: 6.9 g/dL (ref 6.0–8.3)
Total Bilirubin: 0.8 mg/dL (ref 0.2–1.2)

## 2015-06-16 LAB — CBC WITH DIFFERENTIAL/PLATELET
BASOS PCT: 0.7 % (ref 0.0–3.0)
Basophils Absolute: 0 10*3/uL (ref 0.0–0.1)
EOS ABS: 0.3 10*3/uL (ref 0.0–0.7)
EOS PCT: 4.8 % (ref 0.0–5.0)
HEMATOCRIT: 42.4 % (ref 39.0–52.0)
HEMOGLOBIN: 14.3 g/dL (ref 13.0–17.0)
LYMPHS PCT: 42.8 % (ref 12.0–46.0)
Lymphs Abs: 2.4 10*3/uL (ref 0.7–4.0)
MCHC: 33.8 g/dL (ref 30.0–36.0)
MCV: 90.3 fl (ref 78.0–100.0)
MONOS PCT: 5.7 % (ref 3.0–12.0)
Monocytes Absolute: 0.3 10*3/uL (ref 0.1–1.0)
NEUTROS ABS: 2.6 10*3/uL (ref 1.4–7.7)
Neutrophils Relative %: 46 % (ref 43.0–77.0)
Platelets: 212 10*3/uL (ref 150.0–400.0)
RBC: 4.69 Mil/uL (ref 4.22–5.81)
RDW: 14.3 % (ref 11.5–15.5)
WBC: 5.6 10*3/uL (ref 4.0–10.5)

## 2015-06-16 LAB — PSA: PSA: 1.48 ng/mL (ref 0.10–4.00)

## 2015-06-16 LAB — BASIC METABOLIC PANEL
BUN: 13 mg/dL (ref 6–23)
CALCIUM: 9.4 mg/dL (ref 8.4–10.5)
CO2: 26 meq/L (ref 19–32)
CREATININE: 0.86 mg/dL (ref 0.40–1.50)
Chloride: 103 mEq/L (ref 96–112)
GFR: 94.85 mL/min (ref >60.00)
GLUCOSE: 93 mg/dL (ref 70–99)
Potassium: 4.3 mEq/L (ref 3.5–5.1)
SODIUM: 139 meq/L (ref 135–145)

## 2015-06-16 LAB — LIPID PANEL
CHOLESTEROL: 152 mg/dL (ref 0–200)
HDL: 69.9 mg/dL (ref >39.00)
LDL Cholesterol: 73 mg/dL (ref 0–99)
NONHDL: 81.92
TRIGLYCERIDES: 44 mg/dL (ref 0.0–149.0)
Total CHOL/HDL Ratio: 2
VLDL: 8.8 mg/dL (ref 0.0–40.0)

## 2015-06-16 LAB — TSH: TSH: 1.23 u[IU]/mL (ref 0.35–4.50)

## 2015-06-18 ENCOUNTER — Ambulatory Visit (INDEPENDENT_AMBULATORY_CARE_PROVIDER_SITE_OTHER): Payer: Medicare Other | Admitting: Family Medicine

## 2015-06-18 ENCOUNTER — Encounter: Payer: Self-pay | Admitting: Family Medicine

## 2015-06-18 VITALS — BP 130/70 | HR 68 | Temp 98.1°F | Ht 69.0 in | Wt 189.0 lb

## 2015-06-18 DIAGNOSIS — Z23 Encounter for immunization: Secondary | ICD-10-CM | POA: Diagnosis not present

## 2015-06-18 DIAGNOSIS — M19041 Primary osteoarthritis, right hand: Secondary | ICD-10-CM | POA: Diagnosis not present

## 2015-06-18 DIAGNOSIS — M19042 Primary osteoarthritis, left hand: Secondary | ICD-10-CM

## 2015-06-18 DIAGNOSIS — E785 Hyperlipidemia, unspecified: Secondary | ICD-10-CM | POA: Diagnosis not present

## 2015-06-18 DIAGNOSIS — Z0000 Encounter for general adult medical examination without abnormal findings: Secondary | ICD-10-CM | POA: Diagnosis not present

## 2015-06-18 MED ORDER — MELOXICAM 15 MG PO TABS: 15.0000 mg | ORAL_TABLET | Freq: Every day | ORAL | Status: DC

## 2015-06-18 MED ORDER — ATORVASTATIN CALCIUM 40 MG PO TABS: ORAL_TABLET | ORAL | Status: DC

## 2015-06-18 NOTE — Patient Instructions (Signed)
Recommend flu vaccine this Fall. Your tetanus and colonoscopy are up to date.

## 2015-06-18 NOTE — Progress Notes (Signed)
Pre visit review using our clinic review tool, if applicable. No additional management support is needed unless otherwise documented below in the visit note. 

## 2015-06-18 NOTE — Progress Notes (Signed)
Subjective:    Patient ID: [name], male    DOB: 22-Feb-1951, 64 y.o.   MRN: [id number]  HPI Patient seen for Medicare wellness exam and medical follow-up. His chronic problems include history of hyperlipidemia, CAD, osteoarthritis. He's had progressive arthritis issues especially in his hands and wrist as well as low back. He has seen hand surgeon and may be looking at eventual joint replacement left thumb. He is requesting medication. He has tried Tylenol without much relief. He does he has some relief with ibuprofen.  Colonoscopy up-to-date. Tetanus up-to-date. No history of Prevnar 13. He plans to get flu vaccine later this year He has been somewhat inconsistent with exercise over the past year. No recent chest pains. Compliant with Lipitor and requesting refills.  Past Medical History  Diagnosis Date  . CAD 01/15/2009  . DYSLIPIDEMIA 01/15/2009  . HYPERTENSION, UNSPECIFIED 01/15/2009  . Idiopathic urticaria 07/09/2009  . Arthritis   . Blood transfusion 1953  . Glaucoma     ocular hypertention   Past Surgical History  Procedure Laterality Date  . Coronary stent placement  2008  . Wrist fusion  12/2009    left  . Anal fistulectomy  2004    reports that he quit smoking about 7 years ago. He has never used smokeless tobacco. He reports that he drinks alcohol. He reports that he does not use illicit drugs. family history includes COPD in his mother; Cancer (age of onset: 14) in his sister; Cancer (age of onset: 82) in his brother; Colon cancer in his brother; Colon polyps in his sister; Heart disease (age of onset: 58) in his father. Allergies  Allergen Reactions  . Amoxicillin-Pot Clavulanate     Hives??? Augmentin  . Codeine     Hives???   1.  Risk factors based on Past Medical , Social, and Family history reviewed and as indicated above with no changes 2.  Limitations in physical activities None.  No recent falls. 3.  Depression/mood No active depression or anxiety  issues 4.  Hearing No defiits 5.  ADLs independent in all. 6.  Cognitive function (orientation to time and place, language, writing, speech,memory) no short or long term memory issues.  Language and judgement intact. 7.  Home Safety no issues 8.  Height, weight, and visual acuity.all stable. 9.  Counseling discussed weight control and reestablishing more consistent exercise 10. Recommendation of preventive services. Prevnar 13. Recommend flu vaccine this fall 11. Labs based on risk factors recent labs reviewed 12. Care Plan as above 13. Other Providers Dr. Glenna Fellows ortho  Dr Tenny Craw cardiology. 14. Written schedule of screening/prevention services given to patient.    Review of Systems  Constitutional: Negative for fever, activity change, appetite change and fatigue.  HENT: Negative for congestion, ear pain and trouble swallowing.   Eyes: Negative for pain and visual disturbance.  Respiratory: Negative for cough, shortness of breath and wheezing.   Cardiovascular: Negative for chest pain and palpitations.  Gastrointestinal: Negative for nausea, vomiting, abdominal pain, diarrhea, constipation, blood in stool, abdominal distention and rectal pain.  Genitourinary: Negative for dysuria, hematuria and testicular pain.  Musculoskeletal: Positive for arthralgias. Negative for joint swelling.  Skin: Negative for rash.  Neurological: Negative for dizziness, syncope and headaches.  Hematological: Negative for adenopathy.  Psychiatric/Behavioral: Negative for confusion and dysphoric mood.       Objective:   Physical Exam  Constitutional: He is oriented to person, place, and time. He appears well-developed and well-nourished. No distress.  HENT:  Head: Normocephalic and atraumatic.  Right Ear: External ear normal.  Left Ear: External ear normal.  Mouth/Throat: Oropharynx is clear and moist.  Eyes: Conjunctivae and EOM are normal. Pupils are equal, round, and reactive to light.  Neck:  Normal range of motion. Neck supple. No thyromegaly present.  Cardiovascular: Normal rate, regular rhythm and normal heart sounds.   No murmur heard. Pulmonary/Chest: No respiratory distress. He has no wheezes. He has no rales.  Abdominal: Soft. Bowel sounds are normal. He exhibits no distension and no mass. There is no tenderness. There is no rebound and no guarding.  Musculoskeletal: He exhibits no edema.  Lymphadenopathy:    He has no cervical adenopathy.  Neurological: He is alert and oriented to person, place, and time. He displays normal reflexes. No cranial nerve deficit.  Skin: No rash noted.  Psychiatric: He has a normal mood and affect.          Assessment & Plan:  #1 Medicare wellness exam. Recommend flu vaccine this fall. Prevnar 13 given. Tetanus up-to-date. Colonoscopy up-to-date. #2 osteoarthritis involving multiple joints. Meloxicam 15 mg once daily. Reviewed possible side effects. #3 hyperlipidemia. Past history of CAD. Refill Lipitor for one year

## 2015-06-20 DIAGNOSIS — M19041 Primary osteoarthritis, right hand: Secondary | ICD-10-CM

## 2015-06-20 DIAGNOSIS — M19042 Primary osteoarthritis, left hand: Secondary | ICD-10-CM | POA: Insufficient documentation

## 2015-06-20 HISTORY — DX: Primary osteoarthritis, right hand: M19.041

## 2015-06-20 HISTORY — DX: Primary osteoarthritis, right hand: M19.042

## 2015-06-23 DIAGNOSIS — M9903 Segmental and somatic dysfunction of lumbar region: Secondary | ICD-10-CM | POA: Diagnosis not present

## 2015-06-23 DIAGNOSIS — M9905 Segmental and somatic dysfunction of pelvic region: Secondary | ICD-10-CM | POA: Diagnosis not present

## 2015-06-23 DIAGNOSIS — M9904 Segmental and somatic dysfunction of sacral region: Secondary | ICD-10-CM | POA: Diagnosis not present

## 2015-06-23 DIAGNOSIS — M5136 Other intervertebral disc degeneration, lumbar region: Secondary | ICD-10-CM | POA: Diagnosis not present

## 2015-06-25 DIAGNOSIS — M9904 Segmental and somatic dysfunction of sacral region: Secondary | ICD-10-CM | POA: Diagnosis not present

## 2015-06-25 DIAGNOSIS — M9903 Segmental and somatic dysfunction of lumbar region: Secondary | ICD-10-CM | POA: Diagnosis not present

## 2015-06-25 DIAGNOSIS — M5136 Other intervertebral disc degeneration, lumbar region: Secondary | ICD-10-CM | POA: Diagnosis not present

## 2015-06-25 DIAGNOSIS — M9905 Segmental and somatic dysfunction of pelvic region: Secondary | ICD-10-CM | POA: Diagnosis not present

## 2015-07-02 DIAGNOSIS — M9905 Segmental and somatic dysfunction of pelvic region: Secondary | ICD-10-CM | POA: Diagnosis not present

## 2015-07-02 DIAGNOSIS — M9903 Segmental and somatic dysfunction of lumbar region: Secondary | ICD-10-CM | POA: Diagnosis not present

## 2015-07-02 DIAGNOSIS — M9904 Segmental and somatic dysfunction of sacral region: Secondary | ICD-10-CM | POA: Diagnosis not present

## 2015-07-02 DIAGNOSIS — M5136 Other intervertebral disc degeneration, lumbar region: Secondary | ICD-10-CM | POA: Diagnosis not present

## 2015-07-09 DIAGNOSIS — M9904 Segmental and somatic dysfunction of sacral region: Secondary | ICD-10-CM | POA: Diagnosis not present

## 2015-07-09 DIAGNOSIS — M5136 Other intervertebral disc degeneration, lumbar region: Secondary | ICD-10-CM | POA: Diagnosis not present

## 2015-07-09 DIAGNOSIS — M9903 Segmental and somatic dysfunction of lumbar region: Secondary | ICD-10-CM | POA: Diagnosis not present

## 2015-07-09 DIAGNOSIS — M9905 Segmental and somatic dysfunction of pelvic region: Secondary | ICD-10-CM | POA: Diagnosis not present

## 2015-07-23 DIAGNOSIS — M5136 Other intervertebral disc degeneration, lumbar region: Secondary | ICD-10-CM | POA: Diagnosis not present

## 2015-07-23 DIAGNOSIS — M9903 Segmental and somatic dysfunction of lumbar region: Secondary | ICD-10-CM | POA: Diagnosis not present

## 2015-07-23 DIAGNOSIS — M9904 Segmental and somatic dysfunction of sacral region: Secondary | ICD-10-CM | POA: Diagnosis not present

## 2015-07-23 DIAGNOSIS — M9905 Segmental and somatic dysfunction of pelvic region: Secondary | ICD-10-CM | POA: Diagnosis not present

## 2015-09-03 DIAGNOSIS — M5136 Other intervertebral disc degeneration, lumbar region: Secondary | ICD-10-CM | POA: Diagnosis not present

## 2015-09-03 DIAGNOSIS — M9905 Segmental and somatic dysfunction of pelvic region: Secondary | ICD-10-CM | POA: Diagnosis not present

## 2015-09-03 DIAGNOSIS — M9904 Segmental and somatic dysfunction of sacral region: Secondary | ICD-10-CM | POA: Diagnosis not present

## 2015-09-03 DIAGNOSIS — M9903 Segmental and somatic dysfunction of lumbar region: Secondary | ICD-10-CM | POA: Diagnosis not present

## 2015-09-20 ENCOUNTER — Ambulatory Visit (INDEPENDENT_AMBULATORY_CARE_PROVIDER_SITE_OTHER): Payer: Medicare Other | Admitting: Family Medicine

## 2015-09-20 ENCOUNTER — Telehealth: Payer: Self-pay

## 2015-09-20 DIAGNOSIS — Z23 Encounter for immunization: Secondary | ICD-10-CM | POA: Diagnosis not present

## 2015-09-20 MED ORDER — DICLOFENAC SODIUM 75 MG PO TBEC: 75.0000 mg | DELAYED_RELEASE_TABLET | Freq: Two times a day (BID) | ORAL | Status: DC

## 2015-09-20 NOTE — Telephone Encounter (Signed)
D/C meloxicam  Diclofenac 75 mg po bid with food prn arthritis pain #60 with one refill.  He will need office follow up in 2 months if remains on this.  Will need periodic (q 6 month) hepatic monitoring if stays on this.

## 2015-09-21 NOTE — Telephone Encounter (Signed)
Pt is aware via voicemail that Rx has been sent into pharmacy.

## 2015-10-14 DIAGNOSIS — M5136 Other intervertebral disc degeneration, lumbar region: Secondary | ICD-10-CM | POA: Diagnosis not present

## 2015-10-14 DIAGNOSIS — M9903 Segmental and somatic dysfunction of lumbar region: Secondary | ICD-10-CM | POA: Diagnosis not present

## 2015-10-14 DIAGNOSIS — M9904 Segmental and somatic dysfunction of sacral region: Secondary | ICD-10-CM | POA: Diagnosis not present

## 2015-10-14 DIAGNOSIS — M9905 Segmental and somatic dysfunction of pelvic region: Secondary | ICD-10-CM | POA: Diagnosis not present

## 2015-12-17 DIAGNOSIS — M5136 Other intervertebral disc degeneration, lumbar region: Secondary | ICD-10-CM | POA: Diagnosis not present

## 2015-12-17 DIAGNOSIS — M9905 Segmental and somatic dysfunction of pelvic region: Secondary | ICD-10-CM | POA: Diagnosis not present

## 2015-12-17 DIAGNOSIS — M9903 Segmental and somatic dysfunction of lumbar region: Secondary | ICD-10-CM | POA: Diagnosis not present

## 2015-12-17 DIAGNOSIS — M9904 Segmental and somatic dysfunction of sacral region: Secondary | ICD-10-CM | POA: Diagnosis not present

## 2016-01-14 DIAGNOSIS — H40011 Open angle with borderline findings, low risk, right eye: Secondary | ICD-10-CM | POA: Diagnosis not present

## 2016-01-14 DIAGNOSIS — H40012 Open angle with borderline findings, low risk, left eye: Secondary | ICD-10-CM | POA: Diagnosis not present

## 2016-02-29 ENCOUNTER — Ambulatory Visit (INDEPENDENT_AMBULATORY_CARE_PROVIDER_SITE_OTHER): Payer: Medicare Other | Admitting: Family Medicine

## 2016-02-29 ENCOUNTER — Encounter: Payer: Self-pay | Admitting: Family Medicine

## 2016-02-29 VITALS — BP 130/80 | HR 71 | Temp 97.9°F | Ht 69.0 in | Wt 195.8 lb

## 2016-02-29 DIAGNOSIS — M546 Pain in thoracic spine: Secondary | ICD-10-CM | POA: Diagnosis not present

## 2016-02-29 DIAGNOSIS — M549 Dorsalgia, unspecified: Secondary | ICD-10-CM

## 2016-02-29 MED ORDER — PREDNISONE 10 MG PO TABS: ORAL_TABLET | ORAL | Status: DC

## 2016-02-29 NOTE — Progress Notes (Signed)
   Subjective:    Patient ID: [name], male    DOB: 08-11-1951, 65 y.o.   MRN: [id number]  HPI Patient seen with left periscapular pain Onset about 2 weeks ago.  No injury. He denies any cough, shortness of breath, fever, chills, or pleuritic pain. Pain not related to activities and actually somewhat improved with activity. Symptoms tend to be worse at night when he sitting completely still. No skin rash. No neck pain. Took ibuprofen 800 mg 3 times a day without much improvement. No pain with shoulder movement. No abdominal pain No appetite or weight changes.  Past Medical History  Diagnosis Date  . CAD 01/15/2009  . DYSLIPIDEMIA 01/15/2009  . HYPERTENSION, UNSPECIFIED 01/15/2009  . Idiopathic urticaria 07/09/2009  . Arthritis   . Blood transfusion 1953  . Glaucoma     ocular hypertention   Past Surgical History  Procedure Laterality Date  . Coronary stent placement  2008  . Wrist fusion  12/2009    left  . Anal fistulectomy  2004    reports that he quit smoking about 7 years ago. He has never used smokeless tobacco. He reports that he drinks alcohol. He reports that he does not use illicit drugs. family history includes COPD in his mother; Cancer (age of onset: 6451) in his sister; Cancer (age of onset: 8252) in his brother; Colon cancer in his brother; Colon polyps in his sister; Heart disease (age of onset: 5951) in his father. Allergies  Allergen Reactions  . Amoxicillin-Pot Clavulanate     Hives??? Augmentin  . Codeine     Hives???      Review of Systems  Constitutional: Negative for fever and chills.  Respiratory: Negative for cough and shortness of breath.   Cardiovascular: Negative for chest pain.  Gastrointestinal: Negative for abdominal pain.  Skin: Negative for rash.       Objective:   Physical Exam  Constitutional: He appears well-developed and well-nourished.  Cardiovascular: Normal rate and regular rhythm.   Pulmonary/Chest: Effort normal and  breath sounds normal. No respiratory distress. He has no wheezes. He has no rales.  Musculoskeletal:  Minimal tenderness medial to the left scapular region. No spinal tenderness.  Skin: No rash noted.          Assessment & Plan:  Left periscapular pain. Suspect muscular. Improved with activities and worse after prolonged sitting. Recommend topical such as Biofreeze. Trial of prednisone taper starting at 40 mg daily. Touch base in one week if not improving.  Kristian CoveyBruce W Burchette MD [facility]

## 2016-02-29 NOTE — Patient Instructions (Signed)
Touch base in one week if no better. 

## 2016-02-29 NOTE — Progress Notes (Signed)
Pre visit review using our clinic review tool, if applicable. No additional management support is needed unless otherwise documented below in the visit note. 

## 2016-03-06 ENCOUNTER — Ambulatory Visit (INDEPENDENT_AMBULATORY_CARE_PROVIDER_SITE_OTHER): Payer: Medicare Other | Admitting: Family Medicine

## 2016-03-06 ENCOUNTER — Encounter: Payer: Self-pay | Admitting: Family Medicine

## 2016-03-06 VITALS — BP 120/78 | HR 80 | Temp 97.8°F | Ht 69.0 in | Wt 192.0 lb

## 2016-03-06 DIAGNOSIS — H6122 Impacted cerumen, left ear: Secondary | ICD-10-CM | POA: Diagnosis not present

## 2016-03-06 NOTE — Patient Instructions (Signed)
Cerumen Impaction The structures of the external ear canal secrete a waxy substance known as cerumen. Excess cerumen can build up in the ear canal, causing a condition known as cerumen impaction. Cerumen impaction can cause ear pain and disrupt the function of the ear. The rate of cerumen production differs for each individual. In certain individuals, the configuration of the ear canal may decrease his or her ability to naturally remove cerumen. CAUSES Cerumen impaction is caused by excessive cerumen production or buildup. RISK FACTORS  Frequent use of swabs to clean ears.  Having narrow ear canals.  Having eczema.  Being dehydrated. SIGNS AND SYMPTOMS  Diminished hearing.  Ear drainage.  Ear pain.  Ear itch. TREATMENT Treatment may involve:  Over-the-counter or prescription ear drops to soften the cerumen.  Removal of cerumen by a health care provider. This may be done with:  Irrigation with warm water. This is the most common method of removal.  Ear curettes and other instruments.  Surgery. This may be done in severe cases. HOME CARE INSTRUCTIONS  Take medicines only as directed by your health care provider.  Do not insert objects into the ear with the intent of cleaning the ear. PREVENTION  Do not insert objects into the ear, even with the intent of cleaning the ear. Removing cerumen as a part of normal hygiene is not necessary, and the use of swabs in the ear canal is not recommended.  Drink enough water to keep your urine clear or pale yellow.  Control your eczema if you have it. SEEK MEDICAL CARE IF:  You develop ear pain.  You develop bleeding from the ear.  The cerumen does not clear after you use ear drops as directed.   This information is not intended to replace advice given to you by your health care provider. Make sure you discuss any questions you have with your health care provider.   Document Released: 11/16/2004 Document Revised: 10/30/2014  Document Reviewed: 05/26/2015 Elsevier Interactive Patient Education 2016 Elsevier Inc.  

## 2016-03-06 NOTE — Progress Notes (Signed)
Pre visit review using our clinic review tool, if applicable. No additional management support is needed unless otherwise documented below in the visit note. 

## 2016-03-06 NOTE — Progress Notes (Signed)
   Subjective:    Patient ID: [name], male    DOB: 01-09-1951, 65 y.o.   MRN: [id number]  HPI  Patient seen for acute visit for left ear congestion.  First noted a couple weeks ago.  Possible mild hearing loss.  No associated dizziness. No ear pain. No drainage.  No fevers or chills.  History of similar problem problem in the past   Recent upper back pain moderately improved with prednisone.  His job requires lots of driving which he thinks exacerbates.  Past Medical History  Diagnosis Date  . CAD 01/15/2009  . DYSLIPIDEMIA 01/15/2009  . HYPERTENSION, UNSPECIFIED 01/15/2009  . Idiopathic urticaria 07/09/2009  . Arthritis   . Blood transfusion 1953  . Glaucoma     ocular hypertention   Past Surgical History  Procedure Laterality Date  . Coronary stent placement  2008  . Wrist fusion  12/2009    left  . Anal fistulectomy  2004    reports that he quit smoking about 7 years ago. He has never used smokeless tobacco. He reports that he drinks alcohol. He reports that he does not use illicit drugs. family history includes COPD in his mother; Cancer (age of onset: 3851) in his sister; Cancer (age of onset: 5452) in his brother; Colon cancer in his brother; Colon polyps in his sister; Heart disease (age of onset: 2051) in his father. Allergies  Allergen Reactions  . Amoxicillin-Pot Clavulanate     Hives??? Augmentin  . Codeine     Hives???      Review of Systems  Constitutional: Negative for fever and chills.  HENT: Negative for ear discharge and ear pain.        Objective:   Physical Exam  Constitutional: He appears well-developed and well-nourished.  HENT:  Right Ear: External ear normal.  Left eardrum is not visualized-on initial exam. He has impacted cerumen in canal. After irrigation eardrum appears normal  Cardiovascular: Normal rate and regular rhythm.   Pulmonary/Chest: Effort normal and breath sounds normal. No respiratory distress. He has no wheezes. He has no  rales.          Assessment & Plan:  Cerumen impaction left canal. Irrigation with removal of wax. Patient symptomatically better afterwards. Follow-up as needed  Kristian CoveyBruce W Clarie Camey MD [place] [facility]

## 2016-03-07 DIAGNOSIS — M62838 Other muscle spasm: Secondary | ICD-10-CM | POA: Diagnosis not present

## 2016-03-07 DIAGNOSIS — M5134 Other intervertebral disc degeneration, thoracic region: Secondary | ICD-10-CM | POA: Diagnosis not present

## 2016-03-07 DIAGNOSIS — M9902 Segmental and somatic dysfunction of thoracic region: Secondary | ICD-10-CM | POA: Diagnosis not present

## 2016-03-07 DIAGNOSIS — M25512 Pain in left shoulder: Secondary | ICD-10-CM | POA: Diagnosis not present

## 2016-03-13 DIAGNOSIS — M542 Cervicalgia: Secondary | ICD-10-CM | POA: Diagnosis not present

## 2016-03-13 DIAGNOSIS — M25512 Pain in left shoulder: Secondary | ICD-10-CM | POA: Diagnosis not present

## 2016-04-12 DIAGNOSIS — M9902 Segmental and somatic dysfunction of thoracic region: Secondary | ICD-10-CM | POA: Diagnosis not present

## 2016-04-12 DIAGNOSIS — M5134 Other intervertebral disc degeneration, thoracic region: Secondary | ICD-10-CM | POA: Diagnosis not present

## 2016-04-12 DIAGNOSIS — M25512 Pain in left shoulder: Secondary | ICD-10-CM | POA: Diagnosis not present

## 2016-04-12 DIAGNOSIS — M62838 Other muscle spasm: Secondary | ICD-10-CM | POA: Diagnosis not present

## 2016-04-24 DIAGNOSIS — S99821A Other specified injuries of right foot, initial encounter: Secondary | ICD-10-CM | POA: Diagnosis not present

## 2016-04-24 DIAGNOSIS — S93402A Sprain of unspecified ligament of left ankle, initial encounter: Secondary | ICD-10-CM | POA: Diagnosis not present

## 2016-05-15 DIAGNOSIS — S93402D Sprain of unspecified ligament of left ankle, subsequent encounter: Secondary | ICD-10-CM | POA: Diagnosis not present

## 2016-05-15 DIAGNOSIS — S99821D Other specified injuries of right foot, subsequent encounter: Secondary | ICD-10-CM | POA: Diagnosis not present

## 2016-06-09 DIAGNOSIS — S93402D Sprain of unspecified ligament of left ankle, subsequent encounter: Secondary | ICD-10-CM | POA: Diagnosis not present

## 2016-07-09 ENCOUNTER — Other Ambulatory Visit: Payer: Self-pay | Admitting: Family Medicine

## 2016-07-14 DIAGNOSIS — M5136 Other intervertebral disc degeneration, lumbar region: Secondary | ICD-10-CM | POA: Diagnosis not present

## 2016-07-14 DIAGNOSIS — M5031 Other cervical disc degeneration,  high cervical region: Secondary | ICD-10-CM | POA: Diagnosis not present

## 2016-07-14 DIAGNOSIS — M9903 Segmental and somatic dysfunction of lumbar region: Secondary | ICD-10-CM | POA: Diagnosis not present

## 2016-07-14 DIAGNOSIS — M9901 Segmental and somatic dysfunction of cervical region: Secondary | ICD-10-CM | POA: Diagnosis not present

## 2016-08-18 DIAGNOSIS — H40011 Open angle with borderline findings, low risk, right eye: Secondary | ICD-10-CM | POA: Diagnosis not present

## 2016-10-25 DIAGNOSIS — M9905 Segmental and somatic dysfunction of pelvic region: Secondary | ICD-10-CM | POA: Diagnosis not present

## 2016-10-25 DIAGNOSIS — M9904 Segmental and somatic dysfunction of sacral region: Secondary | ICD-10-CM | POA: Diagnosis not present

## 2016-10-25 DIAGNOSIS — M5136 Other intervertebral disc degeneration, lumbar region: Secondary | ICD-10-CM | POA: Diagnosis not present

## 2016-10-25 DIAGNOSIS — M9903 Segmental and somatic dysfunction of lumbar region: Secondary | ICD-10-CM | POA: Diagnosis not present

## 2016-10-27 DIAGNOSIS — M79674 Pain in right toe(s): Secondary | ICD-10-CM | POA: Diagnosis not present

## 2016-10-27 DIAGNOSIS — M79675 Pain in left toe(s): Secondary | ICD-10-CM | POA: Diagnosis not present

## 2016-10-27 DIAGNOSIS — G8929 Other chronic pain: Secondary | ICD-10-CM | POA: Diagnosis not present

## 2016-10-27 DIAGNOSIS — M7742 Metatarsalgia, left foot: Secondary | ICD-10-CM | POA: Diagnosis not present

## 2016-11-13 ENCOUNTER — Ambulatory Visit (INDEPENDENT_AMBULATORY_CARE_PROVIDER_SITE_OTHER): Payer: Medicare Other | Admitting: Family Medicine

## 2016-11-13 ENCOUNTER — Encounter: Payer: Self-pay | Admitting: Family Medicine

## 2016-11-13 VITALS — BP 150/80 | HR 72 | Temp 98.2°F | Ht 69.0 in | Wt 201.8 lb

## 2016-11-13 DIAGNOSIS — B349 Viral infection, unspecified: Secondary | ICD-10-CM

## 2016-11-13 MED ORDER — DESOXIMETASONE 0.25 % EX CREA: TOPICAL_CREAM | CUTANEOUS | 0 refills | Status: DC

## 2016-11-13 NOTE — Progress Notes (Signed)
Subjective:     Patient ID: [name], male   DOB: 03/29/1951, 66 y.o.   MRN: [id number]  HPI Acute visit for 1 day history of upper respiratory symptoms. He developed gradual onset little over 24 hours ago of body aches, cough, and mild headache. No nausea or vomiting. No sore throat. No documented fever. No chills. He works in [employer] but does not recall any specific sick contacts  Past Medical History:  Diagnosis Date  . Arthritis   . Blood transfusion 1953  . CAD 01/15/2009  . DYSLIPIDEMIA 01/15/2009  . Glaucoma    ocular hypertention  . HYPERTENSION, UNSPECIFIED 01/15/2009  . Idiopathic urticaria 07/09/2009   Past Surgical History:  Procedure Laterality Date  . ANAL FISTULECTOMY  2004  . CORONARY STENT PLACEMENT  2008  . WRIST FUSION  12/2009   left    reports that he quit smoking about 8 years ago. He has never used smokeless tobacco. He reports that he drinks alcohol. He reports that he does not use drugs. family history includes COPD in his mother; Cancer (age of onset: 3451) in his sister; Cancer (age of onset: 7152) in his brother; Colon cancer in his brother; Colon polyps in his sister; Heart disease (age of onset: 7051) in his father. Allergies  Allergen Reactions  . Amoxicillin-Pot Clavulanate     Hives??? Augmentin  . Codeine     Hives???     Review of Systems  Constitutional: Positive for fatigue. Negative for chills and fever.  HENT: Negative for sore throat.   Respiratory: Positive for cough.        Objective:   Physical Exam  Constitutional: He appears well-developed and well-nourished.  HENT:  Right Ear: External ear normal.  Left Ear: External ear normal.  Mouth/Throat: Oropharynx is clear and moist.  Neck: Neck supple.  Cardiovascular: Normal rate and regular rhythm.   Pulmonary/Chest: Effort normal and breath sounds normal. No respiratory distress. He has no wheezes. He has no rales.  Lymphadenopathy:    He has no cervical adenopathy.        Assessment:     Probable viral URI with cough. Doubt influenza with no documented fever or sore throat    Plan:     -Treat symptomatically. Stay well-hydrated. -Follow-up for any fever or worsening symptoms  Kristian CoveyBruce W Jamespaul Secrist MD [facility]

## 2016-11-13 NOTE — Patient Instructions (Signed)

## 2016-11-13 NOTE — Progress Notes (Signed)
Pre visit review using our clinic review tool, if applicable. No additional management support is needed unless otherwise documented below in the visit note. 

## 2016-12-20 ENCOUNTER — Encounter: Payer: Self-pay | Admitting: Gastroenterology

## 2017-01-08 DIAGNOSIS — M9904 Segmental and somatic dysfunction of sacral region: Secondary | ICD-10-CM | POA: Diagnosis not present

## 2017-01-08 DIAGNOSIS — M9903 Segmental and somatic dysfunction of lumbar region: Secondary | ICD-10-CM | POA: Diagnosis not present

## 2017-01-08 DIAGNOSIS — M9905 Segmental and somatic dysfunction of pelvic region: Secondary | ICD-10-CM | POA: Diagnosis not present

## 2017-01-08 DIAGNOSIS — M5136 Other intervertebral disc degeneration, lumbar region: Secondary | ICD-10-CM | POA: Diagnosis not present

## 2017-01-31 ENCOUNTER — Ambulatory Visit (INDEPENDENT_AMBULATORY_CARE_PROVIDER_SITE_OTHER): Payer: Medicare Other | Admitting: Family Medicine

## 2017-01-31 ENCOUNTER — Encounter: Payer: Self-pay | Admitting: Family Medicine

## 2017-01-31 VITALS — BP 120/80 | HR 64 | Temp 98.2°F | Wt 195.5 lb

## 2017-01-31 DIAGNOSIS — H6123 Impacted cerumen, bilateral: Secondary | ICD-10-CM | POA: Diagnosis not present

## 2017-01-31 NOTE — Patient Instructions (Signed)
Earwax Buildup Your ears make a substance called earwax. It may also be called cerumen. Sometimes, too much earwax builds up in your ear canal. This can cause ear pain and make it harder for you to hear. CAUSES This condition is caused by too much earwax production or buildup. RISK FACTORS The following factors may make you more likely to develop this condition:  Cleaning your ears often with swabs.  Having narrow ear canals.  Having earwax that is overly thick or sticky.  Having eczema.  Being dehydrated. SYMPTOMS Symptoms of this condition include:  Reduced hearing.  Ear drainage.  Ear pain.  Ear itch.  A feeling of fullness in the ear or feeling that the ear is plugged.  Ringing in the ear.  Coughing. DIAGNOSIS Your health care provider can diagnose this condition based on your symptoms and medical history. Your health care provider will also do an ear exam to look inside your ear with a scope (otoscope). You may also have a hearing test. TREATMENT Treatment for this condition includes:  Over-the-counter or prescription ear drops to soften the earwax.  Earwax removal by a health care provider. This may be done:  By flushing the ear with body-temperature water.  With a medical instrument that has a loop at the end (earwax curette).  With a suction device. HOME CARE INSTRUCTIONS  Take over-the-counter and prescription medicines only as told by your health care provider.  Do not put any objects, including an ear swab, into your ear. You can clean the opening of your ear canal with a washcloth.  Drink enough water to keep your urine clear or pale yellow.  If you have frequent earwax buildup or you use hearing aids, consider seeing your health care provider every 6-12 months for routine preventive ear cleanings. Keep all follow-up visits as told by your health care provider. SEEK MEDICAL CARE IF:  You have ear pain.  Your condition does not improve with  treatment.  You have hearing loss.  You have blood, pus, or other fluid coming from your ear. This information is not intended to replace advice given to you by your health care provider. Make sure you discuss any questions you have with your health care provider. Document Released: 11/16/2004 Document Revised: 01/31/2016 Document Reviewed: 05/26/2015 Elsevier Interactive Patient Education  2017 Elsevier Inc.  Consider Debrox or Cerumenex (OTC wax softeners) for future build up.

## 2017-01-31 NOTE — Progress Notes (Signed)
Subjective:     Patient ID: [name], male   DOB: 1950-11-10, 66 y.o.   MRN: [id number]  HPI   Patient seen as a work in with right ear fullness > left over the past several weeks. He's had cerumen impactions in the past. He has not tried any irrigation at home with topical solutions. Slightly diminished hearing. No dizziness. No vertigo. No drainage. Denies ear pain.  Past Medical History:  Diagnosis Date  . Arthritis   . Blood transfusion 1953  . CAD 01/15/2009  . DYSLIPIDEMIA 01/15/2009  . Glaucoma    ocular hypertention  . HYPERTENSION, UNSPECIFIED 01/15/2009  . Idiopathic urticaria 07/09/2009   Past Surgical History:  Procedure Laterality Date  . ANAL FISTULECTOMY  2004  . CORONARY STENT PLACEMENT  2008  . WRIST FUSION  12/2009   left    reports that he quit smoking about 8 years ago. He has never used smokeless tobacco. He reports that he drinks alcohol. He reports that he does not use drugs. family history includes COPD in his mother; Cancer (age of onset: 2) in his sister; Cancer (age of onset: 33) in his brother; Colon cancer in his brother; Colon polyps in his sister; Heart disease (age of onset: 83) in his father. Allergies  Allergen Reactions  . Amoxicillin-Pot Clavulanate     Hives??? Augmentin  . Codeine     Hives???     Review of Systems  Constitutional: Negative for chills and fever.  HENT: Negative for congestion, ear discharge, ear pain and hearing loss.   Neurological: Negative for dizziness.       Objective:   Physical Exam  Constitutional: He appears well-developed and well-nourished.  HENT:  Bilateral cerumen impactions  TMs appear normal after removal of cerumen.       Assessment:     Bilateral cerumen impactions    Plan:     Irrigation-removed without difficulty. Consider over-the-counter Debrox or Cerumenex for future wax buildup  Kristian Covey MD [facility] at [facility]

## 2017-01-31 NOTE — Progress Notes (Signed)
Pre visit review using our clinic review tool, if applicable. No additional management support is needed unless otherwise documented below in the visit note. 

## 2017-05-09 ENCOUNTER — Other Ambulatory Visit: Payer: Self-pay | Admitting: Family Medicine

## 2017-06-29 DIAGNOSIS — M9903 Segmental and somatic dysfunction of lumbar region: Secondary | ICD-10-CM | POA: Diagnosis not present

## 2017-06-29 DIAGNOSIS — M5136 Other intervertebral disc degeneration, lumbar region: Secondary | ICD-10-CM | POA: Diagnosis not present

## 2017-06-29 DIAGNOSIS — M9902 Segmental and somatic dysfunction of thoracic region: Secondary | ICD-10-CM | POA: Diagnosis not present

## 2017-06-29 DIAGNOSIS — M0000 Staphylococcal arthritis, unspecified joint: Secondary | ICD-10-CM | POA: Diagnosis not present

## 2017-08-06 DIAGNOSIS — H43812 Vitreous degeneration, left eye: Secondary | ICD-10-CM | POA: Diagnosis not present

## 2017-08-10 DIAGNOSIS — H40013 Open angle with borderline findings, low risk, bilateral: Secondary | ICD-10-CM | POA: Diagnosis not present

## 2017-09-06 ENCOUNTER — Ambulatory Visit (INDEPENDENT_AMBULATORY_CARE_PROVIDER_SITE_OTHER): Payer: Medicare Other | Admitting: *Deleted

## 2017-09-06 ENCOUNTER — Other Ambulatory Visit: Payer: Self-pay | Admitting: *Deleted

## 2017-09-06 DIAGNOSIS — Z23 Encounter for immunization: Secondary | ICD-10-CM

## 2017-09-06 NOTE — Telephone Encounter (Signed)
Patient requested refill of atorvastatin while here for flu shot.  Per chart, he should have 3 remaining refills but states he was not able to refill via the automated pharmacy system.  Called pharmacy and they did have rx on file and will refill and notify the patient when ready for pick up.

## 2017-09-10 DIAGNOSIS — M5136 Other intervertebral disc degeneration, lumbar region: Secondary | ICD-10-CM | POA: Diagnosis not present

## 2017-09-10 DIAGNOSIS — M9902 Segmental and somatic dysfunction of thoracic region: Secondary | ICD-10-CM | POA: Diagnosis not present

## 2017-09-10 DIAGNOSIS — M5134 Other intervertebral disc degeneration, thoracic region: Secondary | ICD-10-CM | POA: Diagnosis not present

## 2017-09-10 DIAGNOSIS — M9903 Segmental and somatic dysfunction of lumbar region: Secondary | ICD-10-CM | POA: Diagnosis not present

## 2017-09-21 DIAGNOSIS — H40013 Open angle with borderline findings, low risk, bilateral: Secondary | ICD-10-CM | POA: Diagnosis not present

## 2017-10-05 ENCOUNTER — Encounter: Payer: Self-pay | Admitting: Family Medicine

## 2017-10-05 ENCOUNTER — Ambulatory Visit (INDEPENDENT_AMBULATORY_CARE_PROVIDER_SITE_OTHER): Payer: Medicare Other | Admitting: Family Medicine

## 2017-10-05 VITALS — BP 120/78 | HR 58 | Temp 98.1°F | Ht 70.5 in | Wt 158.0 lb

## 2017-10-05 DIAGNOSIS — Z1159 Encounter for screening for other viral diseases: Secondary | ICD-10-CM | POA: Diagnosis not present

## 2017-10-05 DIAGNOSIS — M19042 Primary osteoarthritis, left hand: Secondary | ICD-10-CM | POA: Diagnosis not present

## 2017-10-05 DIAGNOSIS — E785 Hyperlipidemia, unspecified: Secondary | ICD-10-CM

## 2017-10-05 DIAGNOSIS — M19041 Primary osteoarthritis, right hand: Secondary | ICD-10-CM

## 2017-10-05 DIAGNOSIS — Z23 Encounter for immunization: Secondary | ICD-10-CM

## 2017-10-05 DIAGNOSIS — I2510 Atherosclerotic heart disease of native coronary artery without angina pectoris: Secondary | ICD-10-CM | POA: Diagnosis not present

## 2017-10-05 LAB — LIPID PANEL
CHOLESTEROL: 132 mg/dL (ref 0–200)
HDL: 74.6 mg/dL (ref >39.00)
LDL CALC: 48 mg/dL (ref 0–99)
NonHDL: 57.26
TRIGLYCERIDES: 47 mg/dL (ref 0.0–149.0)
Total CHOL/HDL Ratio: 2
VLDL: 9.4 mg/dL (ref 0.0–40.0)

## 2017-10-05 LAB — HEPATIC FUNCTION PANEL
ALBUMIN: 4.4 g/dL (ref 3.5–5.2)
ALT: 24 U/L (ref 0–53)
AST: 37 U/L (ref 0–37)
Alkaline Phosphatase: 60 U/L (ref 39–117)
BILIRUBIN TOTAL: 0.9 mg/dL (ref 0.2–1.2)
Bilirubin, Direct: 0.2 mg/dL (ref 0.0–0.3)
TOTAL PROTEIN: 6.9 g/dL (ref 6.0–8.3)

## 2017-10-05 MED ORDER — DICLOFENAC SODIUM 75 MG PO TBEC: 75.0000 mg | DELAYED_RELEASE_TABLET | Freq: Two times a day (BID) | ORAL | 1 refills | Status: DC

## 2017-10-05 NOTE — Patient Instructions (Signed)
Set up Medicare Wellness Visit- if interested.

## 2017-10-05 NOTE — Progress Notes (Signed)
Subjective:     Patient ID: [name], male   DOB: 27-Dec-1950, 66 y.o.   MRN: [id number]  HPI Patient seen for medical follow-up. He has history of CAD, hyperlipidemia, osteoarthritis multiple joints, and reported hypertension but currently blood pressure history stable off of antihypertensive medications. He's lost substantial amount of weight from last year (about 40 pounds) due to his efforts. He is exercising regularly and reduce sugars and has basically eliminated alcohol. He feels very good overall. No recent chest pains. No dizziness.  Does have fairly severe osteoarthritis involving the hands and uses diclofenac as needed. Requesting refills. Takes this as needed.  No history of hepatitis C screening. He is overdue for lipid panel. Also needs Pneumovax. He's had previous Prevnar but not Pneumovax since turning 64.  Past Medical History:  Diagnosis Date  . Arthritis   . Blood transfusion 1953  . CAD 01/15/2009  . DYSLIPIDEMIA 01/15/2009  . Glaucoma    ocular hypertention  . HYPERTENSION, UNSPECIFIED 01/15/2009  . Idiopathic urticaria 07/09/2009   Past Surgical History:  Procedure Laterality Date  . ANAL FISTULECTOMY  2004  . CORONARY STENT PLACEMENT  2008  . WRIST FUSION  12/2009   left    reports that he quit smoking about 9 years ago. he has never used smokeless tobacco. He reports that he drinks alcohol. He reports that he does not use drugs. family history includes COPD in his mother; Cancer (age of onset: 6851) in his sister; Cancer (age of onset: 152) in his brother; Colon cancer in his brother; Colon polyps in his sister; Heart disease (age of onset: 7451) in his father. Allergies  Allergen Reactions  . Codeine     Hives???     Review of Systems  Constitutional: Negative for fatigue.  Eyes: Negative for visual disturbance.  Respiratory: Negative for cough, chest tightness and shortness of breath.   Cardiovascular: Negative for chest pain, palpitations and leg  swelling.  Neurological: Negative for dizziness, syncope, weakness, light-headedness and headaches.       Objective:   Physical Exam  Constitutional: He is oriented to person, place, and time. He appears well-developed and well-nourished.  HENT:  Right Ear: External ear normal.  Left Ear: External ear normal.  Mouth/Throat: Oropharynx is clear and moist.  Eyes: Pupils are equal, round, and reactive to light.  Neck: Neck supple. No thyromegaly present.  Cardiovascular: Normal rate and regular rhythm.  Pulmonary/Chest: Effort normal and breath sounds normal. No respiratory distress. He has no wheezes. He has no rales.  Musculoskeletal: He exhibits no edema.  Neurological: He is alert and oriented to person, place, and time.       Assessment:     #1 history of CAD/hyperlipidemia  #2 past history of obesity which has basically been reversed with lifestyle changes  #3 osteoarthritis involving the hands and other joints    Plan:     -check labs with hepatitis C antibody, lipid panel, hepatic panel -Pneumovax given -Consider setting up Medicare wellness visit -Refill diclofenac for as needed use -continue with Lipitor  Kristian CoveyBruce W Burchette MD [facility]

## 2017-10-06 LAB — HEPATITIS C ANTIBODY
Hepatitis C Ab: NONREACTIVE
SIGNAL TO CUT-OFF: 0.01 (ref <1.00)

## 2017-10-26 ENCOUNTER — Ambulatory Visit: Payer: Medicare Other | Admitting: Family Medicine

## 2017-10-31 ENCOUNTER — Ambulatory Visit (INDEPENDENT_AMBULATORY_CARE_PROVIDER_SITE_OTHER): Payer: Medicare Other | Admitting: Family Medicine

## 2017-10-31 ENCOUNTER — Encounter: Payer: Self-pay | Admitting: Family Medicine

## 2017-10-31 VITALS — BP 90/60 | HR 63 | Temp 98.3°F | Ht 70.5 in | Wt 165.4 lb

## 2017-10-31 DIAGNOSIS — H6123 Impacted cerumen, bilateral: Secondary | ICD-10-CM

## 2017-10-31 NOTE — Patient Instructions (Signed)
Earwax Buildup, Adult The ears produce a substance called earwax that helps keep bacteria out of the ear and protects the skin in the ear canal. Occasionally, earwax can build up in the ear and cause discomfort or hearing loss. What increases the risk? This condition is more likely to develop in people who:  Are male.  Are elderly.  Naturally produce more earwax.  Clean their ears often with cotton swabs.  Use earplugs often.  Use in-ear headphones often.  Wear hearing aids.  Have narrow ear canals.  Have earwax that is overly thick or sticky.  Have eczema.  Are dehydrated.  Have excess hair in the ear canal.  What are the signs or symptoms? Symptoms of this condition include:  Reduced or muffled hearing.  A feeling of fullness in the ear or feeling that the ear is plugged.  Fluid coming from the ear.  Ear pain.  Ear itch.  Ringing in the ear.  Coughing.  An obvious piece of earwax that can be seen inside the ear canal.  How is this diagnosed? This condition may be diagnosed based on:  Your symptoms.  Your medical history.  An ear exam. During the exam, your health care provider will look into your ear with an instrument called an otoscope.  You may have tests, including a hearing test. How is this treated? This condition may be treated by:  Using ear drops to soften the earwax.  Having the earwax removed by a health care provider. The health care provider may: ? Flush the ear with water. ? Use an instrument that has a loop on the end (curette). ? Use a suction device.  Surgery to remove the wax buildup. This may be done in severe cases.  Follow these instructions at home:  Take over-the-counter and prescription medicines only as told by your health care provider.  Do not put any objects, including cotton swabs, into your ear. You can clean the opening of your ear canal with a washcloth or facial tissue.  Follow instructions from your health  care provider about cleaning your ears. Do not over-clean your ears.  Drink enough fluid to keep your urine clear or pale yellow. This will help to thin the earwax.  Keep all follow-up visits as told by your health care provider. If earwax builds up in your ears often or if you use hearing aids, consider seeing your health care provider for routine, preventive ear cleanings. Ask your health care provider how often you should schedule your cleanings.  If you have hearing aids, clean them according to instructions from the manufacturer and your health care provider. Contact a health care provider if:  You have ear pain.  You develop a fever.  You have blood, pus, or other fluid coming from your ear.  You have hearing loss.  You have ringing in your ears that does not go away.  Your symptoms do not improve with treatment.  You feel like the room is spinning (vertigo). Summary  Earwax can build up in the ear and cause discomfort or hearing loss.  The most common symptoms of this condition include reduced or muffled hearing and a feeling of fullness in the ear or feeling that the ear is plugged.  This condition may be diagnosed based on your symptoms, your medical history, and an ear exam.  This condition may be treated by using ear drops to soften the earwax or by having the earwax removed by a health care provider.  Do   not put any objects, including cotton swabs, into your ear. You can clean the opening of your ear canal with a washcloth or facial tissue. This information is not intended to replace advice given to you by your health care provider. Make sure you discuss any questions you have with your health care provider. Document Released: 11/16/2004 Document Revised: 12/20/2016 Document Reviewed: 12/20/2016 Elsevier Interactive Patient Education  2018 Elsevier Inc.  

## 2017-10-31 NOTE — Progress Notes (Signed)
Subjective:     Patient ID: Jason NestStevenson E Sampedro, male   DOB: August 14, 1951, 67 y.o.   MRN: [id number]  HPI Patient seen with concern for some cerumen buildup. He was just seen here recently for physical and did not have any significant obstruction at that time point. He has had frequent wax but up in the past. He has slight sensation of fullness. No drainage. No ear pain.  Past Medical History:  Diagnosis Date  . Arthritis   . Blood transfusion 1953  . CAD 01/15/2009  . DYSLIPIDEMIA 01/15/2009  . Glaucoma    ocular hypertention  . HYPERTENSION, UNSPECIFIED 01/15/2009  . Idiopathic urticaria 07/09/2009   Past Surgical History:  Procedure Laterality Date  . ANAL FISTULECTOMY  2004  . CORONARY STENT PLACEMENT  2008  . WRIST FUSION  12/2009   left    reports that he quit smoking about 9 years ago. he has never used smokeless tobacco. He reports that he drinks alcohol. He reports that he does not use drugs. family history includes COPD in his mother; Cancer (age of onset: 5451) in his sister; Cancer (age of onset: 7352) in his brother; Colon cancer in his brother; Colon polyps in his sister; Heart disease (age of onset: 4951) in his father. Allergies  Allergen Reactions  . Codeine     Hives???     Review of Systems  HENT: Negative for ear discharge, ear pain and hearing loss.   Neurological: Negative for dizziness.       Objective:   Physical Exam  HENT:  Head: Normocephalic.  He has small amount of cerumen both canals. Nonobstructing.  Neck: Neck supple.  Lymphadenopathy:    He has no cervical adenopathy.       Assessment:     Bilateral ear cerumen. Nonobstructing    Plan:     -Using a curette, we were able to remove cerumen in entirety. Eardrums appear normal.  Kristian CoveyBruce W Xela Oregel MD [facility]

## 2018-03-28 DIAGNOSIS — M5031 Other cervical disc degeneration,  high cervical region: Secondary | ICD-10-CM | POA: Diagnosis not present

## 2018-03-28 DIAGNOSIS — M9901 Segmental and somatic dysfunction of cervical region: Secondary | ICD-10-CM | POA: Diagnosis not present

## 2018-03-30 ENCOUNTER — Other Ambulatory Visit: Payer: Self-pay | Admitting: Family Medicine

## 2018-04-16 DIAGNOSIS — M542 Cervicalgia: Secondary | ICD-10-CM | POA: Diagnosis not present

## 2018-05-29 ENCOUNTER — Other Ambulatory Visit: Payer: Self-pay | Admitting: Family Medicine

## 2018-09-10 ENCOUNTER — Ambulatory Visit (INDEPENDENT_AMBULATORY_CARE_PROVIDER_SITE_OTHER): Payer: Medicare Other

## 2018-09-10 DIAGNOSIS — Z23 Encounter for immunization: Secondary | ICD-10-CM

## 2018-10-11 ENCOUNTER — Other Ambulatory Visit: Payer: Self-pay | Admitting: Family Medicine

## 2018-10-11 DIAGNOSIS — H40023 Open angle with borderline findings, high risk, bilateral: Secondary | ICD-10-CM | POA: Diagnosis not present

## 2018-11-12 ENCOUNTER — Encounter: Payer: Self-pay | Admitting: Family Medicine

## 2018-11-12 ENCOUNTER — Ambulatory Visit (INDEPENDENT_AMBULATORY_CARE_PROVIDER_SITE_OTHER): Payer: Medicare Other | Admitting: Family Medicine

## 2018-11-12 ENCOUNTER — Other Ambulatory Visit: Payer: Self-pay

## 2018-11-12 VITALS — BP 132/76 | HR 69 | Temp 97.5°F | Wt 167.1 lb

## 2018-11-12 DIAGNOSIS — H6123 Impacted cerumen, bilateral: Secondary | ICD-10-CM | POA: Diagnosis not present

## 2018-11-12 NOTE — Progress Notes (Signed)
  Subjective:     Patient ID: [name], male   DOB: 12-30-1950, 68 y.o.   MRN: [id number]  HPI Patient is seen with bilateral ear fullness.  He has had problems with cerumen impaction in the past.  Left ear greater than right.  First noted some problems couple weeks ago.  Was worse after getting out of the shower.  Denies any ear pain.  No drainage.  No dizziness.  Past Medical History:  Diagnosis Date  . Arthritis   . Blood transfusion 1953  . CAD 01/15/2009  . DYSLIPIDEMIA 01/15/2009  . Glaucoma    ocular hypertention  . HYPERTENSION, UNSPECIFIED 01/15/2009  . Idiopathic urticaria 07/09/2009   Past Surgical History:  Procedure Laterality Date  . ANAL FISTULECTOMY  2004  . CORONARY STENT PLACEMENT  2008  . WRIST FUSION  12/2009   left    reports that he quit smoking about 10 years ago. He has never used smokeless tobacco. He reports current alcohol use. He reports that he does not use drugs. family history includes COPD in his mother; Cancer (age of onset: 47) in his sister; Cancer (age of onset: 46) in his brother; Colon cancer in his brother; Colon polyps in his sister; Heart disease (age of onset: 53) in his father. Allergies  Allergen Reactions  . Codeine     Hives???     Review of Systems  HENT: Positive for hearing loss. Negative for ear discharge and ear pain.   Neurological: Negative for dizziness.       Objective:   Physical Exam HENT:     Ears:     Comments: Cerumen impaction bilaterally. Cardiovascular:     Rate and Rhythm: Normal rate and regular rhythm.  Pulmonary:     Effort: Pulmonary effort is normal.     Breath sounds: Normal breath sounds.        Assessment:     Bilateral cerumen impactions.  Removed by irrigation without difficulty and pt had improved symptoms.    Plan:     Follow up as needed for recurrent symptoms. Discussed possible home remedies for recurrent cerumen.  Kristian Covey MD [facility] at [facility]

## 2018-11-12 NOTE — Patient Instructions (Signed)
Earwax Buildup, Adult  The ears produce a substance called earwax that helps keep bacteria out of the ear and protects the skin in the ear canal. Occasionally, earwax can build up in the ear and cause discomfort or hearing loss.  What increases the risk?  This condition is more likely to develop in people who:  · Are male.  · Are elderly.  · Naturally produce more earwax.  · Clean their ears often with cotton swabs.  · Use earplugs often.  · Use in-ear headphones often.  · Wear hearing aids.  · Have narrow ear canals.  · Have earwax that is overly thick or sticky.  · Have eczema.  · Are dehydrated.  · Have excess hair in the ear canal.  What are the signs or symptoms?  Symptoms of this condition include:  · Reduced or muffled hearing.  · A feeling of fullness in the ear or feeling that the ear is plugged.  · Fluid coming from the ear.  · Ear pain.  · Ear itch.  · Ringing in the ear.  · Coughing.  · An obvious piece of earwax that can be seen inside the ear canal.  How is this diagnosed?  This condition may be diagnosed based on:  · Your symptoms.  · Your medical history.  · An ear exam. During the exam, your health care provider will look into your ear with an instrument called an otoscope.  You may have tests, including a hearing test.  How is this treated?  This condition may be treated by:  · Using ear drops to soften the earwax.  · Having the earwax removed by a health care provider. The health care provider may:  ? Flush the ear with water.  ? Use an instrument that has a loop on the end (curette).  ? Use a suction device.  · Surgery to remove the wax buildup. This may be done in severe cases.  Follow these instructions at home:    · Take over-the-counter and prescription medicines only as told by your health care provider.  · Do not put any objects, including cotton swabs, into your ear. You can clean the opening of your ear canal with a washcloth or facial tissue.  · Follow instructions from your health care  provider about cleaning your ears. Do not over-clean your ears.  · Drink enough fluid to keep your urine clear or pale yellow. This will help to thin the earwax.  · Keep all follow-up visits as told by your health care provider. If earwax builds up in your ears often or if you use hearing aids, consider seeing your health care provider for routine, preventive ear cleanings. Ask your health care provider how often you should schedule your cleanings.  · If you have hearing aids, clean them according to instructions from the manufacturer and your health care provider.  Contact a health care provider if:  · You have ear pain.  · You develop a fever.  · You have blood, pus, or other fluid coming from your ear.  · You have hearing loss.  · You have ringing in your ears that does not go away.  · Your symptoms do not improve with treatment.  · You feel like the room is spinning (vertigo).  Summary  · Earwax can build up in the ear and cause discomfort or hearing loss.  · The most common symptoms of this condition include reduced or muffled hearing and a feeling of   fullness in the ear or feeling that the ear is plugged.  · This condition may be diagnosed based on your symptoms, your medical history, and an ear exam.  · This condition may be treated by using ear drops to soften the earwax or by having the earwax removed by a health care provider.  · Do not put any objects, including cotton swabs, into your ear. You can clean the opening of your ear canal with a washcloth or facial tissue.  This information is not intended to replace advice given to you by your health care provider. Make sure you discuss any questions you have with your health care provider.  Document Released: 11/16/2004 Document Revised: 09/20/2017 Document Reviewed: 12/20/2016  Elsevier Interactive Patient Education © 2019 [employer]

## 2018-11-20 ENCOUNTER — Other Ambulatory Visit: Payer: Self-pay | Admitting: Family Medicine

## 2018-11-20 NOTE — Telephone Encounter (Signed)
He should be setting up CPE.  Refill for one month until he can set up.

## 2018-11-20 NOTE — Telephone Encounter (Signed)
Last labs in the system are from 2018. Please advise if OK to fill or does patient need an appointment with labs?

## 2018-12-05 ENCOUNTER — Telehealth: Payer: Self-pay

## 2018-12-05 NOTE — Telephone Encounter (Signed)
Author phoned pt. to assess interest in scheduling awv. Author left detailed VM asking for return call.

## 2018-12-06 ENCOUNTER — Telehealth: Payer: Self-pay

## 2018-12-06 ENCOUNTER — Ambulatory Visit (INDEPENDENT_AMBULATORY_CARE_PROVIDER_SITE_OTHER): Payer: Medicare Other

## 2018-12-06 ENCOUNTER — Ambulatory Visit (INDEPENDENT_AMBULATORY_CARE_PROVIDER_SITE_OTHER): Payer: Medicare Other | Admitting: Family Medicine

## 2018-12-06 ENCOUNTER — Encounter: Payer: Self-pay | Admitting: Family Medicine

## 2018-12-06 VITALS — BP 116/80 | HR 55 | Temp 97.5°F | Ht 71.0 in | Wt 169.0 lb

## 2018-12-06 VITALS — BP 116/80 | HR 55 | Temp 97.5°F | Ht 71.0 in | Wt 169.2 lb

## 2018-12-06 DIAGNOSIS — I2510 Atherosclerotic heart disease of native coronary artery without angina pectoris: Secondary | ICD-10-CM | POA: Diagnosis not present

## 2018-12-06 DIAGNOSIS — Z800 Family history of malignant neoplasm of digestive organs: Secondary | ICD-10-CM | POA: Diagnosis not present

## 2018-12-06 DIAGNOSIS — Z0000 Encounter for general adult medical examination without abnormal findings: Secondary | ICD-10-CM | POA: Diagnosis not present

## 2018-12-06 DIAGNOSIS — Z125 Encounter for screening for malignant neoplasm of prostate: Secondary | ICD-10-CM

## 2018-12-06 DIAGNOSIS — R39198 Other difficulties with micturition: Secondary | ICD-10-CM | POA: Diagnosis not present

## 2018-12-06 DIAGNOSIS — R972 Elevated prostate specific antigen [PSA]: Secondary | ICD-10-CM

## 2018-12-06 DIAGNOSIS — E785 Hyperlipidemia, unspecified: Secondary | ICD-10-CM

## 2018-12-06 LAB — BASIC METABOLIC PANEL
BUN: 14 mg/dL (ref 6–23)
CO2: 28 mEq/L (ref 19–32)
CREATININE: 0.91 mg/dL (ref 0.40–1.50)
Calcium: 9.8 mg/dL (ref 8.4–10.5)
Chloride: 103 mEq/L (ref 96–112)
GFR: 82.73 mL/min (ref >60.00)
Glucose, Bld: 92 mg/dL (ref 70–99)
Potassium: 4.8 mEq/L (ref 3.5–5.1)
Sodium: 139 mEq/L (ref 135–145)

## 2018-12-06 LAB — LIPID PANEL
CHOLESTEROL: 160 mg/dL (ref 0–200)
HDL: 85.1 mg/dL (ref >39.00)
LDL CALC: 65 mg/dL (ref 0–99)
NonHDL: 74.51
TRIGLYCERIDES: 48 mg/dL (ref 0.0–149.0)
Total CHOL/HDL Ratio: 2
VLDL: 9.6 mg/dL (ref 0.0–40.0)

## 2018-12-06 LAB — HEPATIC FUNCTION PANEL
ALBUMIN: 4.7 g/dL (ref 3.5–5.2)
ALT: 19 U/L (ref 0–53)
AST: 24 U/L (ref 0–37)
Alkaline Phosphatase: 66 U/L (ref 39–117)
BILIRUBIN DIRECT: 0.2 mg/dL (ref 0.0–0.3)
TOTAL PROTEIN: 7.1 g/dL (ref 6.0–8.3)
Total Bilirubin: 0.8 mg/dL (ref 0.2–1.2)

## 2018-12-06 LAB — PSA, MEDICARE: PSA: 3.37 ng/mL (ref 0.10–4.00)

## 2018-12-06 NOTE — Progress Notes (Signed)
Subjective:     Patient ID: [name], male   DOB: [birth year], 68 y.o.   MRN: [id number]  HPI Patient here for medical follow-up.  He will be getting Medicare wellness visit today with our health coach.  His past history significant for hyperlipidemia, CAD, past history of obesity.  He had a tremendous job with weight loss couple years ago lost about 40 pounds and is maintaining this.  He exercises regularly.  No recent chest pains.  He had stent placed in 1 vessel back in 2011 is done well since then.  Brother had colon cancer.  He is overdue for repeat colonoscopy.  He has had some recent slow urine stream and occasional urine frequency.  No burning.  He does not feel this warrants medication yet.  He has not had any recent PSA screening.  No family history of prostate cancer.  Tetanus is due.  Other immunizations up-to-date  Past Medical History:  Diagnosis Date  . Arthritis   . Blood transfusion 1953  . CAD 01/15/2009  . DYSLIPIDEMIA 01/15/2009  . Glaucoma    ocular hypertention  . HYPERTENSION, UNSPECIFIED 01/15/2009  . Idiopathic urticaria 07/09/2009   Past Surgical History:  Procedure Laterality Date  . ANAL FISTULECTOMY  2004  . CORONARY STENT PLACEMENT  2008  . WRIST FUSION  12/2009   left    reports that he quit smoking about 10 years ago. He has never used smokeless tobacco. He reports current alcohol use. He reports that he does not use drugs. family history includes COPD in his mother; Cancer (age of onset: 40) in his sister; Cancer (age of onset: 36) in his brother; Colon cancer in his brother; Colon polyps in his sister; Heart disease (age of onset: 17) in his father. Allergies  Allergen Reactions  . Codeine     Hives???     Review of Systems  Constitutional: Negative for fatigue.  Eyes: Negative for visual disturbance.  Respiratory: Negative for cough, chest tightness and shortness of breath.   Cardiovascular: Negative for chest pain, palpitations and leg  swelling.  Endocrine: Negative for polydipsia and polyuria.  Genitourinary: Positive for frequency.  Neurological: Negative for dizziness, syncope, weakness, light-headedness and headaches.       Objective:   Physical Exam Constitutional:      Appearance: He is well-developed.  HENT:     Right Ear: External ear normal.     Left Ear: External ear normal.  Eyes:     Pupils: Pupils are equal, round, and reactive to light.  Neck:     Musculoskeletal: Neck supple.     Thyroid: No thyromegaly.  Cardiovascular:     Rate and Rhythm: Normal rate and regular rhythm.  Pulmonary:     Effort: Pulmonary effort is normal. No respiratory distress.     Breath sounds: Normal breath sounds. No wheezing or rales.  Genitourinary:    Comments: Prostate is only mildly enlarged.  No rectal mass Neurological:     Mental Status: He is alert and oriented to person, place, and time.        Assessment:     #1 history of CAD.  Symptomatically stable.  #2 history of dyslipidemia  #3 probable mild BPH    Plan:     -Check labs with lipids, hepatic, basic metabolic panel -The natural history of prostate cancer and ongoing controversy regarding screening and potential treatment outcomes of prostate cancer has been discussed with the patient. The meaning of a false positive  PSA and a false negative PSA has been discussed. He indicates understanding of the limitations of this screening test and wishes to proceed with screening PSA testing. -Patient will get Medicare wellness visit today -Set up repeat colonoscopy  Kristian Covey MD [facility] at [facility]

## 2018-12-06 NOTE — Patient Instructions (Signed)
We will set up repeat colonoscopy.   

## 2018-12-06 NOTE — Telephone Encounter (Signed)
During AWV, pt. wanted to know Dr. Lucie Leather recommendation on possibly more affordable alternative steroidal cream for his eczema that is not topicort. Pt. stated he has tried cortisone OTC but does not work. Author stated she would get PCP input.

## 2018-12-06 NOTE — Progress Notes (Signed)
Subjective:   [name] is a 68 y.o. male who presents for Medicare Annual/Subsequent preventive examination.  Review of Systems:  No ROS.  Medicare Wellness Visit. Additional risk factors are reflected in the social history.  Cardiac Risk Factors include: male gender;hypertension;advanced age (>58men, >4 women);dyslipidemia Sleep patterns: feels rested on waking and sleeps 8 hours nightly. Has noticed some more frequency in urination, PCP aware, ordering PSA.  Home Safety/Smoke Alarms: Feels safe in home. Smoke alarms in place.  Living environment; residence and Firearm Safety: 1-story house/ trailerNo use or need for DME at this time.  Seat Belt Safety/Bike Helmet: Wears seat belt.  Male:   CCS- 02/09/2012, repeat 5 years per Dr. Juanda Chance. Overdue, Pt. OK to order, but stated Dr. Caryl Never was going to do so. Will follow-up with PCP. PSA- ordered by Dr. Caryl Never today Lab Results  Component Value Date   PSA 1.48 06/16/2015   PSA 1.00 02/04/2014   PSA 0.97 01/24/2013       Objective:    Vitals: BP 116/80   Pulse (!) 55   Temp (!) 97.5 F (36.4 C)   Ht 5\' 11"  (1.803 m)   Wt 169 lb (76.7 kg)   SpO2 98%   BMI 23.57 kg/m   Body mass index is 23.57 kg/m.  Advanced Directives 12/06/2018  Does Patient Have a Medical Advance Directive? No  Would patient like information on creating a medical advance directive? Yes (MAU/Ambulatory/Procedural Areas - Information given)    Tobacco Social History   Tobacco Use  Smoking Status Former Smoker  . Last attempt to quit: 03/23/2008  . Years since quitting: 10.7  Smokeless Tobacco Never Used     Counseling given: Not Answered   Past Medical History:  Diagnosis Date  . Arthritis   . Blood transfusion 1953  . CAD 01/15/2009  . DYSLIPIDEMIA 01/15/2009  . Glaucoma    ocular hypertention  . HYPERTENSION, UNSPECIFIED 01/15/2009  . Idiopathic urticaria 07/09/2009   Past Surgical History:  Procedure Laterality Date  . ANAL  FISTULECTOMY  2004  . CORONARY STENT PLACEMENT  2008  . WRIST FUSION  12/2009   left   Family History  Problem Relation Age of Onset  . COPD Mother   . Heart disease Father 1       CHF  . Cancer Sister 93       ?lung cancer  . Colon polyps Sister   . Cancer Brother 66       colon cancer  . Colon cancer Brother    Social History   Socioeconomic History  . Marital status: Single    Spouse name: Not on file  . Number of children: 0  . Years of education: Not on file  . Highest education level: Not on file  Occupational History  . Occupation: Airline pilot    Comment: full time  Social Needs  . Financial resource strain: Not hard at all  . Food insecurity:    Worry: Never true    Inability: Never true  . Transportation needs:    Medical: No    Non-medical: No  Tobacco Use  . Smoking status: Former Smoker    Last attempt to quit: 03/23/2008    Years since quitting: 10.7  . Smokeless tobacco: Never Used  Substance and Sexual Activity  . Alcohol use: Yes    Comment: 1-2 drinks a year  . Drug use: No  . Sexual activity: Not on file  Lifestyle  . Physical activity:  Days per week: 3 days    Minutes per session: 90 min  . Stress: Not at all  Relationships  . Social connections:    Talks on phone: Once a week    Gets together: Once a week    Attends religious service: Never    Active member of club or organization: No    Attends meetings of clubs or organizations: Never    Relationship status: Not on file  Other Topics Concern  . Not on file  Social History Narrative   Lives alone with 2 dogs, one level living   Lovingravels, drives much for work   Enjoys doing yardwork, environmentally-conscious    Outpatient Encounter Medications as of 12/06/2018  Medication Sig  . ALPHA LIPOIC ACID PO Take 100 mg by mouth daily.  Marland Kitchen. atorvastatin (LIPITOR) 40 MG tablet TAKE 1 TABLET BY MOUTH EVERY DAY  . Coenzyme Q10 (COQ10 PO) Take by mouth daily.  Marland Kitchen. desoximetasone (TOPICORT) 0.25 %  cream APPLY TO AFFECTED AREA AS NEEDED  . diclofenac (VOLTAREN) 75 MG EC tablet TAKE 1 TABLET BY MOUTH TWICE A DAY (Patient taking differently: 2 (two) times daily as needed. )  . GLUTAMINE PO Take by mouth.  . Levocarnitine-Ca Pantothenate (L-CARNITINE TRIPLE STRENGTH) 1500-10 MG/15ML LIQD Take 1,500 mg by mouth.  . Multiple Vitamins-Minerals (MULTIVITAMIN WITH MINERALS) tablet Take 1 tablet by mouth daily.  . Probiotic Product (PROBIOTIC-10 PO) Take by mouth daily as needed.   . fish oil-omega-3 fatty acids 1000 MG capsule Take 1 g by mouth daily.   No facility-administered encounter medications on file as of 12/06/2018.     Activities of Daily Living In your present state of health, do you have any difficulty performing the following activities: 12/06/2018  Hearing? N  Vision? N  Difficulty concentrating or making decisions? N  Walking or climbing stairs? N  Dressing or bathing? N  Doing errands, shopping? N  Preparing Food and eating ? N  Using the Toilet? N  In the past six months, have you accidently leaked urine? N  Do you have problems with loss of bowel control? N  Managing your Medications? N  Managing your Finances? N  Housekeeping or managing your Housekeeping? N  Some recent data might be hidden    Patient Care Team: Kristian CoveyBurchette, Bruce W, MD as PCP - General Dominica SeverinGramig, William, MD as Consulting Physician (Orthopedic Surgery) Sinda DuBowen, Bradley, MD as Consulting Physician (Ophthalmology)   Assessment:   This is a routine wellness examination for [name]. Physical assessment deferred to PCP.   Exercise Activities and Dietary recommendations Current Exercise Habits: Structured exercise class, Type of exercise: calisthenics;strength training/weights, Time (Minutes): 60, Frequency (Times/Week): 3, Weekly Exercise (Minutes/Week): 180, Intensity: Intense, Exercise limited by: cardiac condition(s);Other - see comments(osteoarthritis). Exercises at the gym.  Diet (meal preparation,  eat out, water intake, caffeinated beverages, dairy products, fruits and vegetables): in general, a "healthy" diet  , high fiber, states he has cut out most meat except fish, and feels really well on it.  Pt. states he is trying to drink more water, at least 32oz daily, brings filtered water with him when he travels. Benefits of continued hydration reviewed.        Goals    . Patient Stated     Maintain exercise and vegetarian diet regimine.        Fall Risk Fall Risk  12/06/2018 12/06/2018 10/05/2017 06/18/2015  Falls in the past year? 0 0 Yes No  Number falls in past yr: -  0 1 -  Injury with Fall? - 0 Yes -     Depression Screen PHQ 2/9 Scores 12/06/2018 10/05/2017 06/18/2015  PHQ - 2 Score 0 0 0  PHQ- 9 Score 0 - -    Cognitive Function       Ad8 score reviewed for issues:  Issues making decisions: no  Less interest in hobbies / activities: no  Repeats questions, stories (family complaining): no  Trouble using ordinary gadgets (microwave, computer, phone):no  Forgets the month or year: no  Mismanaging finances: no  Remembering appts: no  Daily problems with thinking and/or memory: no Ad8 score is= 0  Immunization History  Administered Date(s) Administered  . Influenza, High Dose Seasonal PF 09/20/2015, 09/06/2017, 09/10/2018  . Pneumococcal Conjugate-13 06/18/2015  . Pneumococcal Polysaccharide-23 11/21/2010, 10/05/2017  . Td 10/24/2007  . Zoster 12/11/2011    Qualifies for Shingles Vaccine? Yes, instructed to go to local pharmacy to inquire about cost. VIS form provided.   Screening Tests Health Maintenance  Topic Date Due  . COLONOSCOPY  02/08/2017  . TETANUS/TDAP  12/07/2019 (Originally 10/23/2017)  . INFLUENZA VACCINE  Completed  . Hepatitis C Screening  Completed  . PNA vac Low Risk Adult  Completed        Plan:    Review advance directive information provided and bring a copy of your living will and/or healthcare power of attorney to your next  office visit.  Continue to stay hydrated (at least 32oz water daily)  Consider getting shingrix at local pharmacy, information provided  Will follow up with Dr. Caryl Never about alternative steroid cream for eczema. In the meantime, check out goodrx.com, covermymeds.com, or singlecare.com for rx savings.  We will be in touch about lab results. I have personally reviewed and noted the following in the patient's chart:   . Medical and social history . Use of alcohol, tobacco or illicit drugs  . Current medications and supplements . Functional ability and status . Nutritional status . Physical activity . Advanced directives . List of other physicians . Vitals . Screenings to include cognitive, depression, and falls . Referrals and appointments  In addition, I have reviewed and discussed with patient certain preventive protocols, quality metrics, and best practice recommendations. A written personalized care plan for preventive services as well as general preventive health recommendations were provided to patient.     Brayton Layman, RN  12/06/2018

## 2018-12-06 NOTE — Patient Instructions (Addendum)
Review advance directive information provided and bring a copy of your living will and/or healthcare power of attorney to your next office visit.  Continue to stay hydrated (at least 32oz water daily)  Consider getting shingrix at local pharmacy, information provided  Will follow up with Dr. Caryl NeverBurchette about alternative steroid cream for eczema. In the meantime, check out goodrx.com, [email or web address], or singlecare.com for rx savings.  We will be in touch about lab results.  Great to meet you!!   [name] , Thank you for taking time to come for your Medicare Wellness Visit. I appreciate your ongoing commitment to your health goals. Please review the following plan we discussed and let me know if I can assist you in the future.   These are the goals we discussed: Goals    . Patient Stated     Maintain exercise and vegetarian diet regimine.        This is a list of the screening recommended for you and due dates:  Health Maintenance  Topic Date Due  . Colon Cancer Screening  02/08/2017  . Tetanus Vaccine  12/07/2019*  . Flu Shot  Completed  .  Hepatitis C: One time screening is recommended by Center for Disease Control  (CDC) for  adults born from [birth year] through 1965.   Completed  . Pneumonia vaccines  Completed  *Topic was postponed. The date shown is not the original due date.     Colonoscopy, Adult A colonoscopy is an exam to look at the entire large intestine. During the exam, a lubricated, flexible tube that has a camera on the end of it is inserted into the anus and then passed into the rectum, colon, and other parts of the large intestine. You may have a colonoscopy as a part of normal colorectal screening or if you have certain symptoms, such as:  Lack of red blood cells (anemia).  Diarrhea that does not go away.  Abdominal pain.  Blood in your stool (feces). A colonoscopy can help screen for and diagnose medical problems,  including:  Tumors.  Polyps.  Inflammation.  Areas of bleeding. Tell a health care provider about:  Any allergies you have.  All medicines you are taking, including vitamins, herbs, eye drops, creams, and over-the-counter medicines.  Any problems you or family members have had with anesthetic medicines.  Any blood disorders you have.  Any surgeries you have had.  Any medical conditions you have.  Any problems you have had passing stool. What are the risks? Generally, this is a safe procedure. However, problems may occur, including:  Bleeding.  A tear in the intestine.  A reaction to medicines given during the exam.  Infection (rare). What happens before the procedure? Eating and drinking restrictions Follow instructions from your health care provider about eating and drinking, which may include:  A few days before the procedure - follow a low-fiber diet. Avoid nuts, seeds, dried fruit, raw fruits, and vegetables.  1-3 days before the procedure - follow a clear liquid diet. Drink only clear liquids, such as clear broth or bouillon, black coffee or tea, clear juice, clear soft drinks or sports drinks, gelatin dessert, and popsicles. Avoid any liquids that contain red or purple dye.  On the day of the procedure - do not eat or drink anything starting 2 hours before the procedure, or within the time period that your health care provider recommends. Up to 2 hours before the procedure, you may continue to drink clear liquids, such as water  or clear fruit juice. Bowel prep If you were prescribed an oral bowel prep to clean out your colon:  Take it as told by your health care provider. Starting the day before your procedure, you will need to drink a large amount of medicated liquid. The liquid will cause you to have multiple loose stools until your stool is almost clear or light green.  If your skin or anus gets irritated from diarrhea, you may use these to relieve the  irritation: ? Medicated wipes, such as adult wet wipes with aloe and vitamin E. ? A skin-soothing product like petroleum jelly.  If you vomit while drinking the bowel prep, take a break for up to 60 minutes and then begin the bowel prep again. If vomiting continues and you cannot take the bowel prep without vomiting, call your health care provider.  To clean out your colon, you may also be given: ? Laxative medicines. ? Instructions about how to use an enema. General instructions  Ask your health care provider about: ? Changing or stopping your regular medicines or supplements. This is especially important if you are taking iron supplements, diabetes medicines, or blood thinners. ? Taking medicines such as aspirin and ibuprofen. These medicines can thin your blood. Do not take these medicines before the procedure if your health care provider tells you not to.  Plan to have someone take you home from the hospital or clinic. What happens during the procedure?   An IV may be inserted into one of your veins.  You will be given medicine to help you relax (sedative).  To reduce your risk of infection: ? Your health care team will wash or sanitize their hands. ? Your anal area will be washed with soap.  You will be asked to lie on your side with your knees bent.  Your health care provider will lubricate a long, thin, flexible tube. The tube will have a camera and a light on the end.  The tube will be inserted into your anus.  The tube will be gently eased through your rectum and colon.  Air will be delivered into your colon to keep it open. You may feel some pressure or cramping.  The camera will be used to take images during the procedure.  A small tissue sample may be removed to be examined under a microscope (biopsy).  If small polyps are found, your health care provider may remove them and have them checked for cancer cells.  When the exam is done, the tube will be  removed. The procedure may vary among health care providers and hospitals. What happens after the procedure?  Your blood pressure, heart rate, breathing rate, and blood oxygen level will be monitored until the medicines you were given have worn off.  Do not drive for 24 hours after the exam.  You may have a small amount of blood in your stool.  You may pass gas and have mild abdominal cramping or bloating due to the air that was used to inflate your colon during the exam.  It is up to you to get the results of your procedure. Ask your health care provider, or the department performing the procedure, when your results will be ready. Summary  A colonoscopy is an exam to look at the entire large intestine.  During a colonoscopy, a lubricated, flexible tube with a camera on the end of it is inserted into the anus and then passed into the colon and other parts of the large  intestine.  Follow instructions from your health care provider about eating and drinking before the procedure.  If you were prescribed an oral bowel prep to clean out your colon, take it as told by your health care provider.  After your procedure, your blood pressure, heart rate, breathing rate, and blood oxygen level will be monitored until the medicines you were given have worn off. This information is not intended to replace advice given to you by your health care provider. Make sure you discuss any questions you have with your health care provider. Document Released: 10/06/2000 Document Revised: 08/01/2017 Document Reviewed: 12/21/2015 Elsevier Interactive Patient Education  2019 [employer].   Health Maintenance, Male A healthy lifestyle and preventive care is important for your health and wellness. Ask your health care provider about what schedule of regular examinations is right for you. What should I know about weight and diet? Eat a Healthy Diet  Eat plenty of vegetables, fruits, whole grains, low-fat dairy  products, and lean protein.  Do not eat a lot of foods high in solid fats, added sugars, or salt.  Maintain a Healthy Weight Regular exercise can help you achieve or maintain a healthy weight. You should:  Do at least 150 minutes of exercise each week. The exercise should increase your heart rate and make you sweat (moderate-intensity exercise).  Do strength-training exercises at least twice a week. Watch Your Levels of Cholesterol and Blood Lipids  Have your blood tested for lipids and cholesterol every 5 years starting at 69 years of age. If you are at high risk for heart disease, you should start having your blood tested when you are 69 years old. You may need to have your cholesterol levels checked more often if: ? Your lipid or cholesterol levels are high. ? You are older than 69 years of age. ? You are at high risk for heart disease. What should I know about cancer screening? Many types of cancers can be detected early and may often be prevented. Lung Cancer  You should be screened every year for lung cancer if: ? You are a current smoker who has smoked for at least 30 years. ? You are a former smoker who has quit within the past 15 years.  Talk to your health care provider about your screening options, when you should start screening, and how often you should be screened. Colorectal Cancer  Routine colorectal cancer screening usually begins at 69 years of age and should be repeated every 5-10 years until you are 69 years old. You may need to be screened more often if early forms of precancerous polyps or small growths are found. Your health care provider may recommend screening at an earlier age if you have risk factors for colon cancer.  Your health care provider may recommend using home test kits to check for hidden blood in the stool.  A small camera at the end of a tube can be used to examine your colon (sigmoidoscopy or colonoscopy). This checks for the earliest forms of  colorectal cancer. Prostate and Testicular Cancer  Depending on your age and overall health, your health care provider may do certain tests to screen for prostate and testicular cancer.  Talk to your health care provider about any symptoms or concerns you have about testicular or prostate cancer. Skin Cancer  Check your skin from head to toe regularly.  Tell your health care provider about any new moles or changes in moles, especially if: ? There is a change  in a mole's size, shape, or color. ? You have a mole that is larger than a pencil eraser.  Always use sunscreen. Apply sunscreen liberally and repeat throughout the day.  Protect yourself by wearing long sleeves, pants, a wide-brimmed hat, and sunglasses when outside. What should I know about heart disease, diabetes, and high blood pressure?  If you are 3-62 years of age, have your blood pressure checked every 3-5 years. If you are 38 years of age or older, have your blood pressure checked every year. You should have your blood pressure measured twice-once when you are at a hospital or clinic, and once when you are not at a hospital or clinic. Record the average of the two measurements. To check your blood pressure when you are not at a hospital or clinic, you can use: ? An automated blood pressure machine at a pharmacy. ? A home blood pressure monitor.  Talk to your health care provider about your target blood pressure.  If you are between 34-79 years old, ask your health care provider if you should take aspirin to prevent heart disease.  Have regular diabetes screenings by checking your fasting blood sugar level. ? If you are at a normal weight and have a low risk for diabetes, have this test once every three years after the age of 31. ? If you are overweight and have a high risk for diabetes, consider being tested at a younger age or more often.  A one-time screening for abdominal aortic aneurysm (AAA) by ultrasound is  recommended for men aged 65-75 years who are current or former smokers. What should I know about preventing infection? Hepatitis B If you have a higher risk for hepatitis B, you should be screened for this virus. Talk with your health care provider to find out if you are at risk for hepatitis B infection. Hepatitis C Blood testing is recommended for:  Everyone born from [birth year] through 1965.  Anyone with known risk factors for hepatitis C. Sexually Transmitted Diseases (STDs)  You should be screened each year for STDs including gonorrhea and chlamydia if: ? You are sexually active and are younger than 69 years of age. ? You are older than 69 years of age and your health care provider tells you that you are at risk for this type of infection. ? Your sexual activity has changed since you were last screened and you are at an increased risk for chlamydia or gonorrhea. Ask your health care provider if you are at risk.  Talk with your health care provider about whether you are at high risk of being infected with HIV. Your health care provider may recommend a prescription medicine to help prevent HIV infection. What else can I do?  Schedule regular health, dental, and eye exams.  Stay current with your vaccines (immunizations).  Do not use any tobacco products, such as cigarettes, chewing tobacco, and e-cigarettes. If you need help quitting, ask your health care provider.  Limit alcohol intake to no more than 2 drinks per day. One drink equals 12 ounces of beer, 5 ounces of wine, or 1 ounces of hard liquor.  Do not use street drugs.  Do not share needles.  Ask your health care provider for help if you need support or information about quitting drugs.  Tell your health care provider if you often feel depressed.  Tell your health care provider if you have ever been abused or do not feel safe at home. This information is not intended  to replace advice given to you by your health care  provider. Make sure you discuss any questions you have with your health care provider. Document Released: 04/06/2008 Document Revised: 06/07/2016 Document Reviewed: 07/13/2015 Elsevier Interactive Patient Education  2019 [employer].

## 2018-12-08 NOTE — Telephone Encounter (Signed)
I would go ahead and switch to Triamcinolone 0.1% cream -apply twice daily as needed  Disp #30 grams with 3 refills.

## 2018-12-11 NOTE — Telephone Encounter (Signed)
Patient calling and states that he had been told to look online at coupons and states that he found a medication that is a lot cheaper. Would like to know if Dr Caryl Never could send a prescription for "desoximetsone topical cream 0.25% 60 gram tube" to [facility] - [place], [place] - [address]. Please advise.

## 2018-12-12 MED ORDER — DESOXIMETASONE 0.25 % EX CREA: TOPICAL_CREAM | CUTANEOUS | 0 refills | Status: DC

## 2018-12-12 MED ORDER — DESOXIMETASONE 0.25 % EX CREA: TOPICAL_CREAM | CUTANEOUS | 1 refills | Status: DC

## 2018-12-12 NOTE — Telephone Encounter (Signed)
Pt.'s specific med request routed to Dr. Caryl Never.

## 2018-12-12 NOTE — Telephone Encounter (Signed)
May send in Desoximetasone 0.25% topical cream use once to twice daily as needed.  #60 grams with one refill

## 2018-12-12 NOTE — Telephone Encounter (Signed)
rx sent in for generic only per patient request to [facility]. Nothing further needed.

## 2018-12-21 ENCOUNTER — Other Ambulatory Visit: Payer: Self-pay | Admitting: Family Medicine

## 2019-01-09 NOTE — Addendum Note (Signed)
Addended by: Johnella Moloney on: 01/09/2019 08:59 AM   Modules accepted: Orders

## 2019-04-08 ENCOUNTER — Other Ambulatory Visit: Payer: Self-pay

## 2019-04-08 ENCOUNTER — Other Ambulatory Visit (INDEPENDENT_AMBULATORY_CARE_PROVIDER_SITE_OTHER): Payer: Medicare Other

## 2019-04-08 ENCOUNTER — Other Ambulatory Visit: Payer: Medicare Other

## 2019-04-08 DIAGNOSIS — R972 Elevated prostate specific antigen [PSA]: Secondary | ICD-10-CM | POA: Diagnosis not present

## 2019-04-08 LAB — PSA: PSA: 1.6 ng/mL (ref 0.10–4.00)

## 2019-04-27 ENCOUNTER — Other Ambulatory Visit: Payer: Self-pay | Admitting: Family Medicine

## 2019-09-10 ENCOUNTER — Other Ambulatory Visit: Payer: Self-pay

## 2019-09-11 ENCOUNTER — Ambulatory Visit (INDEPENDENT_AMBULATORY_CARE_PROVIDER_SITE_OTHER): Payer: Medicare Other | Admitting: *Deleted

## 2019-09-11 ENCOUNTER — Other Ambulatory Visit: Payer: Self-pay | Admitting: *Deleted

## 2019-09-11 DIAGNOSIS — Z23 Encounter for immunization: Secondary | ICD-10-CM

## 2019-09-11 MED ORDER — DICLOFENAC SODIUM 75 MG PO TBEC: 75.0000 mg | DELAYED_RELEASE_TABLET | Freq: Two times a day (BID) | ORAL | 0 refills | Status: DC | PRN

## 2019-09-11 NOTE — Telephone Encounter (Signed)
Rx done and the pt was informed. 

## 2019-12-14 ENCOUNTER — Ambulatory Visit: Payer: Medicare Other | Attending: Internal Medicine

## 2019-12-14 DIAGNOSIS — Z23 Encounter for immunization: Secondary | ICD-10-CM | POA: Insufficient documentation

## 2019-12-14 NOTE — Progress Notes (Signed)
   Covid-19 Vaccination Clinic  Name:  [name]    MRN: [id number] DOB: 05-Aug-1951  12/14/2019  [name] was observed post Covid-19 immunization for 15 minutes without incidence. He was provided with Vaccine Information Sheet and instruction to access the V-Safe system.   [name] was instructed to call 911 with any severe reactions post vaccine: Marland Kitchen Difficulty breathing  . Swelling of your face and throat  . A fast heartbeat  . A bad rash all over your body  . Dizziness and weakness    Immunizations Administered    Name Date Dose VIS Date Route   Pfizer COVID-19 Vaccine 12/14/2019 10:20 AM 0.3 mL 10/03/2019 Intramuscular   Manufacturer: [employer], [employer]   Lot: [id number]   NDC: [address]-0

## 2020-01-07 ENCOUNTER — Ambulatory Visit: Payer: Medicare Other | Attending: Internal Medicine

## 2020-01-07 DIAGNOSIS — Z23 Encounter for immunization: Secondary | ICD-10-CM

## 2020-01-07 NOTE — Progress Notes (Signed)
   Covid-19 Vaccination Clinic  Name:  ALMER BUSHEY    MRN: [id number] DOB: 14-Apr-1951  01/07/2020  Mr. Zylstra was observed post Covid-19 immunization for 15 minutes without incident. He was provided with Vaccine Information Sheet and instruction to access the V-Safe system.   Mr. Hehl was instructed to call 911 with any severe reactions post vaccine: Marland Kitchen Difficulty breathing  . Swelling of face and throat  . A fast heartbeat  . A bad rash all over body  . Dizziness and weakness   Immunizations Administered    Name Date Dose VIS Date Route   Pfizer COVID-19 Vaccine 01/07/2020  9:26 AM 0.3 mL 10/03/2019 Intramuscular   Manufacturer: [employer], [employer]   Lot: [id number]   NDC: [address]-2

## 2020-01-28 ENCOUNTER — Ambulatory Visit (INDEPENDENT_AMBULATORY_CARE_PROVIDER_SITE_OTHER): Payer: Medicare Other

## 2020-01-28 ENCOUNTER — Encounter: Payer: Self-pay | Admitting: Family Medicine

## 2020-01-28 ENCOUNTER — Ambulatory Visit (INDEPENDENT_AMBULATORY_CARE_PROVIDER_SITE_OTHER): Payer: Medicare Other | Admitting: Family Medicine

## 2020-01-28 ENCOUNTER — Other Ambulatory Visit: Payer: Self-pay

## 2020-01-28 VITALS — BP 116/72 | HR 75 | Temp 98.0°F | Wt 170.9 lb

## 2020-01-28 DIAGNOSIS — M79642 Pain in left hand: Secondary | ICD-10-CM

## 2020-01-28 NOTE — Progress Notes (Signed)
  Subjective:     Patient ID: [name], male   DOB: 1951/05/27, 69 y.o.   MRN: [id number]  HPI   [name] is seen with left second finger pain.  He states he went to open a car door this morning and felt a sharp severe shooting pain mostly at the MCP joint of the second digit of the left hand.  He has some prominent bony enlargement of the joint chronically.  He states his finger seem to be somewhat angulated at the time and eventually with some pressure seemed to go back in place.  He has some persistent pain in slightly restricted range of motion at this time at the left second MCP joint.  Past Medical History:  Diagnosis Date  . Arthritis   . Blood transfusion 1953  . CAD 01/15/2009  . DYSLIPIDEMIA 01/15/2009  . Glaucoma    ocular hypertention  . HYPERTENSION, UNSPECIFIED 01/15/2009  . Idiopathic urticaria 07/09/2009   Past Surgical History:  Procedure Laterality Date  . ANAL FISTULECTOMY  2004  . CORONARY STENT PLACEMENT  2008  . WRIST FUSION  12/2009   left    reports that he quit smoking about 11 years ago. He has never used smokeless tobacco. He reports current alcohol use. He reports that he does not use drugs. family history includes COPD in his mother; Cancer (age of onset: 16) in his sister; Cancer (age of onset: 44) in his brother; Colon cancer in his brother; Colon polyps in his sister; Heart disease (age of onset: 45) in his father. Allergies  Allergen Reactions  . Codeine     Hives???     Review of Systems  Neurological: Negative for weakness and numbness.       Objective:   Physical Exam Vitals reviewed.  Constitutional:      Appearance: Normal appearance.  Cardiovascular:     Rate and Rhythm: Normal rate and regular rhythm.  Musculoskeletal:     Comments: He has tenderness left second MCP joint.  No erythema.  No ecchymosis.  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  He has bony prominence of the left second MCP  joint.  No abnormal angulation.  He is able to flex and extend PIP and DIP joints of the left second digit  Neurological:     Mental Status: He is alert.        Assessment:     Left second MCP joint pain following opening of car earlier today.  Appears to have some probably chronic degenerative changes that joint    Plan:     -Obtain x-rays of left hand to further assess= degenerative changes multiple joints (esp left 2nd MCP).   No acute fx seen and this will be over-read.   -we taped 2nd to 3rd finger for splinting effect for short term (couple of days) -try some icing several times daily. -he has seen hand surgeon in past and will set up follow up with them if pain persists.    Kristian Covey MD [facility]

## 2020-01-28 NOTE — Patient Instructions (Signed)
X-ray did not show any fracture  Try some icing 15-20 minutes several times daily  Let me know if range of motion not improving over next few days.

## 2020-01-30 ENCOUNTER — Telehealth: Payer: Self-pay | Admitting: *Deleted

## 2020-01-30 NOTE — Telephone Encounter (Signed)
Patient called after hours line. Patient reports he was in the office yesterday to be seen for an injured hand. Caller states he called an Hydrologist - Dr Cyndee Brightly is needing the office notes about the injury

## 2020-01-30 NOTE — Telephone Encounter (Signed)
This has been faxed.

## 2020-01-30 NOTE — Telephone Encounter (Signed)
Advised pt that notes have been faxed.

## 2020-02-09 ENCOUNTER — Other Ambulatory Visit: Payer: Self-pay | Admitting: Family Medicine

## 2020-02-09 DIAGNOSIS — M79642 Pain in left hand: Secondary | ICD-10-CM | POA: Diagnosis not present

## 2020-02-09 DIAGNOSIS — M13849 Other specified arthritis, unspecified hand: Secondary | ICD-10-CM | POA: Diagnosis not present

## 2020-02-09 DIAGNOSIS — M13841 Other specified arthritis, right hand: Secondary | ICD-10-CM | POA: Diagnosis not present

## 2020-03-25 ENCOUNTER — Other Ambulatory Visit: Payer: Self-pay

## 2020-03-26 ENCOUNTER — Ambulatory Visit (INDEPENDENT_AMBULATORY_CARE_PROVIDER_SITE_OTHER): Payer: Medicare Other | Admitting: Family Medicine

## 2020-03-26 ENCOUNTER — Encounter: Payer: Self-pay | Admitting: Family Medicine

## 2020-03-26 VITALS — BP 124/78 | HR 67 | Temp 97.6°F | Ht 71.0 in | Wt 172.0 lb

## 2020-03-26 DIAGNOSIS — M19041 Primary osteoarthritis, right hand: Secondary | ICD-10-CM | POA: Diagnosis not present

## 2020-03-26 DIAGNOSIS — Z8619 Personal history of other infectious and parasitic diseases: Secondary | ICD-10-CM

## 2020-03-26 DIAGNOSIS — M19042 Primary osteoarthritis, left hand: Secondary | ICD-10-CM | POA: Diagnosis not present

## 2020-03-26 DIAGNOSIS — K429 Umbilical hernia without obstruction or gangrene: Secondary | ICD-10-CM | POA: Diagnosis not present

## 2020-03-26 DIAGNOSIS — Z0000 Encounter for general adult medical examination without abnormal findings: Secondary | ICD-10-CM

## 2020-03-26 DIAGNOSIS — Z125 Encounter for screening for malignant neoplasm of prostate: Secondary | ICD-10-CM

## 2020-03-26 DIAGNOSIS — Z800 Family history of malignant neoplasm of digestive organs: Secondary | ICD-10-CM

## 2020-03-26 DIAGNOSIS — E785 Hyperlipidemia, unspecified: Secondary | ICD-10-CM

## 2020-03-26 DIAGNOSIS — H6122 Impacted cerumen, left ear: Secondary | ICD-10-CM | POA: Diagnosis not present

## 2020-03-26 HISTORY — DX: Personal history of other infectious and parasitic diseases: Z86.19

## 2020-03-26 LAB — BASIC METABOLIC PANEL
BUN: 21 mg/dL (ref 6–23)
CO2: 27 mEq/L (ref 19–32)
Calcium: 9.5 mg/dL (ref 8.4–10.5)
Chloride: 103 mEq/L (ref 96–112)
Creatinine, Ser: 0.75 mg/dL (ref 0.40–1.50)
GFR: 103.02 mL/min (ref >60.00)
Glucose, Bld: 92 mg/dL (ref 70–99)
Potassium: 4.6 mEq/L (ref 3.5–5.1)
Sodium: 137 mEq/L (ref 135–145)

## 2020-03-26 LAB — HEPATIC FUNCTION PANEL
ALT: 21 U/L (ref 0–53)
AST: 28 U/L (ref 0–37)
Albumin: 4.7 g/dL (ref 3.5–5.2)
Alkaline Phosphatase: 64 U/L (ref 39–117)
Bilirubin, Direct: 0.2 mg/dL (ref 0.0–0.3)
Total Bilirubin: 0.8 mg/dL (ref 0.2–1.2)
Total Protein: 7 g/dL (ref 6.0–8.3)

## 2020-03-26 LAB — LIPID PANEL
Cholesterol: 165 mg/dL (ref 0–200)
HDL: 90.7 mg/dL (ref >39.00)
LDL Cholesterol: 66 mg/dL (ref 0–99)
NonHDL: 73.8
Total CHOL/HDL Ratio: 2
Triglycerides: 38 mg/dL (ref 0.0–149.0)
VLDL: 7.6 mg/dL (ref 0.0–40.0)

## 2020-03-26 LAB — PSA, MEDICARE: PSA: 1.64 ng/ml (ref 0.10–4.00)

## 2020-03-26 NOTE — Patient Instructions (Signed)
Preventive Care 89 Years and Older, Male Preventive care refers to lifestyle choices and visits with your health care provider that can promote health and wellness. This includes:  A yearly physical exam. This is also called an annual well check.  Regular dental and eye exams.  Immunizations.  Screening for certain conditions.  Healthy lifestyle choices, such as diet and exercise. What can I expect for my preventive care visit? Physical exam Your health care provider will check:  Height and weight. These may be used to calculate body mass index (BMI), which is a measurement that tells if you are at a healthy weight.  Heart rate and blood pressure.  Your skin for abnormal spots. Counseling Your health care provider may ask you questions about:  Alcohol, tobacco, and drug use.  Emotional well-being.  Home and relationship well-being.  Sexual activity.  Eating habits.  History of falls.  Memory and ability to understand (cognition).  Work and work Statistician. What immunizations do I need?  Influenza (flu) vaccine  This is recommended every year. Tetanus, diphtheria, and pertussis (Tdap) vaccine  You may need a Td booster every 10 years. Varicella (chickenpox) vaccine  You may need this vaccine if you have not already been vaccinated. Zoster (shingles) vaccine  You may need this after age 70. Pneumococcal conjugate (PCV13) vaccine  One dose is recommended after age 40. Pneumococcal polysaccharide (PPSV23) vaccine  One dose is recommended after age 24. Measles, mumps, and rubella (MMR) vaccine  You may need at least one dose of MMR if you were born in 1957 or later. You may also need a second dose. Meningococcal conjugate (MenACWY) vaccine  You may need this if you have certain conditions. Hepatitis A vaccine  You may need this if you have certain conditions or if you travel or work in places where you may be exposed to hepatitis A. Hepatitis B  vaccine  You may need this if you have certain conditions or if you travel or work in places where you may be exposed to hepatitis B. Haemophilus influenzae type b (Hib) vaccine  You may need this if you have certain conditions. You may receive vaccines as individual doses or as more than one vaccine together in one shot (combination vaccines). Talk with your health care provider about the risks and benefits of combination vaccines. What tests do I need? Blood tests  Lipid and cholesterol levels. These may be checked every 5 years, or more frequently depending on your overall health.  Hepatitis C test.  Hepatitis B test. Screening  Lung cancer screening. You may have this screening every year starting at age 70 if you have a 30-pack-year history of smoking and currently smoke or have quit within the past 15 years.  Colorectal cancer screening. All adults should have this screening starting at age 70 and continuing until age 70. Your health care provider may recommend screening at age 70 if you are at increased risk. You will have tests every 1-10 years, depending on your results and the type of screening test.  Prostate cancer screening. Recommendations will vary depending on your family history and other risks.  Diabetes screening. This is done by checking your blood sugar (glucose) after you have not eaten for a while (fasting). You may have this done every 1-3 years.  Abdominal aortic aneurysm (AAA) screening. You may need this if you are a current or former smoker.  Sexually transmitted disease (STD) testing. Follow these instructions at home: Eating and drinking  Eat  a diet that includes fresh fruits and vegetables, whole grains, lean protein, and low-fat dairy products. Limit your intake of foods with high amounts of sugar, saturated fats, and salt.  Take vitamin and mineral supplements as recommended by your health care provider.  Do not drink alcohol if your health care  provider tells you not to drink.  If you drink alcohol: ? Limit how much you have to 0-2 drinks a day. ? Be aware of how much alcohol is in your drink. In the [place] one drink equals one 12 oz bottle of beer (355 mL), one 5 oz glass of wine (148 mL), or one 1 oz glass of hard liquor (44 mL). Lifestyle  Take daily care of your teeth and gums.  Stay active. Exercise for at least 30 minutes on 5 or more days each week.  Do not use any products that contain nicotine or tobacco, such as cigarettes, e-cigarettes, and chewing tobacco. If you need help quitting, ask your health care provider.  If you are sexually active, practice safe sex. Use a condom or other form of protection to prevent STIs (sexually transmitted infections).  Talk with your health care provider about taking a low-dose aspirin or statin. What's next?  Visit your health care provider once a year for a well check visit.  Ask your health care provider how often you should have your eyes and teeth checked.  Stay up to date on all vaccines. This information is not intended to replace advice given to you by your health care provider. Make sure you discuss any questions you have with your health care provider. Document Revised: 10/03/2018 Document Reviewed: 10/03/2018 Elsevier Patient Education  [place] Maintenance  Topic Date Due  . COLONOSCOPY  02/08/2017  . TETANUS/TDAP  10/23/2017  . INFLUENZA VACCINE  05/23/2020  . COVID-19 Vaccine  Completed  . Hepatitis C Screening  Completed  . PNA vac Low Risk Adult  Completed

## 2020-03-26 NOTE — Progress Notes (Signed)
Subjective:     Patient ID: [name], male   DOB: 1951/09/25, 69 y.o.   MRN: [id number]  HPI   [name] is seen today for Medicare subsequent annual wellness visit and medical follow-up.  He has history of hyperlipidemia and is on Lipitor.  Needs follow-up labs.  No significant myalgias.  He has new issue of swelling periumbilical area.  He plans to go back to the gym soon.  This is nonpainful.  He is concerned about hernia.  No prior history of hernia repair.  Left ear fullness.  History of cerumen impactions previously.  Hearing slightly impaired recently from this.  He thinks this is related to cerumen impaction.  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  He only has flareups about once per year.  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  He has seen hand surgeon regarding this in the past  Past Medical History:  Diagnosis Date  . Arthritis   . Blood transfusion 1953  . CAD 01/15/2009  . DYSLIPIDEMIA 01/15/2009  . Glaucoma    ocular hypertention  . HYPERTENSION, UNSPECIFIED 01/15/2009  . Idiopathic urticaria 07/09/2009   Past Surgical History:  Procedure Laterality Date  . ANAL FISTULECTOMY  2004  . CORONARY STENT PLACEMENT  2008  . WRIST FUSION  12/2009   left    reports that he quit smoking about 12 years ago. He has never used smokeless tobacco. He reports current alcohol use. He reports that he does not use drugs. family history includes COPD in his mother; Cancer (age of onset: 108) in his sister; Cancer (age of onset: 10) in his brother; Colon cancer in his brother; Colon polyps in his sister; Heart disease (age of onset: 62) in his father. Allergies  Allergen Reactions  . Codeine     Hives???   1.  Risk factors based on Past Medical , Social, and Family history reviewed and as indicated above with no changes-with exception that he states he has had history of type II genital herpes but has  infrequent flareups only about once per year.  He is not interested in prophylactic medication for that  2.  Limitations in physical activities None.  No recent falls. He feels his balance is good.  He plans to start going back to the gym soon for regular exercise.  Still working full-time.  3.  Depression/mood No active depression or anxiety issues.  PHQ-2=0  4.  Hearing -some recent mild subjective reduction in hearing left ear.  He is concerned about cerumen impaction as above.  Otherwise, has had no difficulties  5.  ADLs independent in all.  Still works full-time.  6.  Cognitive function (orientation to time and place, language, writing, speech,memory) no short or long term memory issues.  Language and judgement intact.  No issues with short-term recall.   7.  Home Safety no issues.    8.  Height, weight, and visual acuity.all stable.  He has gotten regular eye exams in the past per ophthalmology  Wt Readings from Last 3 Encounters:  03/26/20 172 lb (78 kg)  01/28/20 170 lb 14.4 oz (77.5 kg)  12/06/18 169 lb (76.7 kg)    9.  Counseling discussed Counseled regarding age and gender appropriate preventative screenings and immunizations.  10. Recommendation of preventive services.  We discussed Shingrix vaccine and he has checked previously at pharmacy but still has significant co-pay and is decided against that  currently.  Has had previous Zostavax.  Does need tetanus but is understanding that Medicare would not pay in absence of injury.  He does need repeat colonoscopy with positive family history of colon cancer in brother and is overdue for 5-year follow-up.  11. Labs based on risk factors-lipid panel, hepatic panel, basic metabolic panel  12. Care Plan-as below  13. Other Providers-Dr. Sinda Du, ophthalmology  14. Written schedule of screening/prevention services given to patient. Health Maintenance  Topic Date Due  . COLONOSCOPY  02/08/2017  . TETANUS/TDAP   10/23/2017  . INFLUENZA VACCINE  05/23/2020  . COVID-19 Vaccine  Completed  . Hepatitis C Screening  Completed  . PNA vac Low Risk Adult  Completed      Review of Systems  Constitutional: Negative for activity change, appetite change, fatigue, fever and unexpected weight change.  HENT: Negative for congestion, ear pain and trouble swallowing.   Eyes: Negative for pain and visual disturbance.  Respiratory: Negative for cough, shortness of breath and wheezing.   Cardiovascular: Negative for chest pain and palpitations.  Gastrointestinal: Negative for abdominal pain, blood in stool, constipation, diarrhea, nausea, rectal pain and vomiting.  Genitourinary: Negative for dysuria, hematuria and testicular pain.  Musculoskeletal: Negative for arthralgias and joint swelling.  Skin: Negative for rash.  Neurological: Negative for dizziness, syncope, weakness and headaches.  Hematological: Negative for adenopathy. Does not bruise/bleed easily.  Psychiatric/Behavioral: Negative for confusion and dysphoric mood.       Objective:   Physical Exam Vitals reviewed.  Constitutional:      General: He is not in acute distress.    Appearance: He is well-developed.  HENT:     Head: Normocephalic and atraumatic.     Right Ear: External ear normal.     Ears:     Comments: Cerumen impaction left canal Eyes:     Conjunctiva/sclera: Conjunctivae normal.     Pupils: Pupils are equal, round, and reactive to light.  Neck:     Thyroid: No thyromegaly.  Cardiovascular:     Rate and Rhythm: Normal rate and regular rhythm.     Heart sounds: Normal heart sounds. No murmur.  Pulmonary:     Effort: No respiratory distress.     Breath sounds: No wheezing or rales.  Abdominal:     General: Bowel sounds are normal. There is no distension.     Palpations: Abdomen is soft. There is no mass.     Tenderness: There is no abdominal tenderness. There is no guarding or rebound.     Hernia: A hernia is present.      Comments: He has small umbilical hernia which is soft and nontender  Musculoskeletal:     Cervical back: Normal range of motion and neck supple.     Right lower leg: No edema.     Left lower leg: No edema.  Lymphadenopathy:     Cervical: No cervical adenopathy.  Skin:    Findings: No rash.  Neurological:     Mental Status: He is alert and oriented to person, place, and time.     Cranial Nerves: No cranial nerve deficit.     Deep Tendon Reflexes: Reflexes normal.  Psychiatric:        Mood and Affect: Mood normal.        Thought Content: Thought content normal.        Assessment:     #1 Medicare subsequent annual wellness visit-we addressed health maintenance issues as below.  He is due for  repeat colonoscopy.  He was still considering Shingrix vaccine at pharmacy.  #2 umbilical hernia-he is undecided whether to pursue surgical repair at this time and wishes to observe unless symptomatic.  He may consider referral later this year  #3 family history of colon cancer in brother.  Overdue for repeat colonoscopy and he is willing to set up at this time  #4 cerumen impaction left ear-  #5 hyperlipidemia treated with Lipitor and due for follow-up labs  #6 history of genital herpes.  No current flareup.  Infrequent flareups of about once per year    Plan:     -Discussed that he needs tetanus but he would like to wait on that  -We discussed Shingrix vaccine and he is considering getting this at some point this year at pharmacy  -Left ear was irrigated by nurse after discussion of risk and benefits.  Ear irrigated with removal of cerumen and patient tolerated well.  -Set up referral for repeat colonoscopy  -Discussed possible surgical referral for hernia but he wishes to wait at this time  -Check labs with lipid panel, hepatic panel, basic metabolic panel  -The natural history of prostate cancer and ongoing controversy regarding screening and potential treatment outcomes of  prostate cancer has been discussed with the patient. The meaning of a false positive PSA and a false negative PSA has been discussed. He indicates understanding of the limitations of this screening test and wishes to proceed with screening PSA testing.  Billy Covey MD [facility]

## 2020-03-31 ENCOUNTER — Telehealth: Payer: Self-pay | Admitting: Family Medicine

## 2020-03-31 DIAGNOSIS — K429 Umbilical hernia without obstruction or gangrene: Secondary | ICD-10-CM

## 2020-03-31 NOTE — Telephone Encounter (Signed)
Please advise 

## 2020-03-31 NOTE — Addendum Note (Signed)
Addended by: Raiford Simmonds R on: 03/31/2020 11:14 AM   Modules accepted: Orders

## 2020-03-31 NOTE — Telephone Encounter (Signed)
Pt is calling to see if Dr. Caryl Never can get him connect with the general surgeon that he was talking about for his hernia that he is now having problem with.

## 2020-03-31 NOTE — Telephone Encounter (Signed)
Left detailed message for pt letting him know referral for [facility] [place] [facility] has been placed

## 2020-03-31 NOTE — Telephone Encounter (Signed)
Set up referral to [facility] surgery

## 2020-04-07 DIAGNOSIS — M13832 Other specified arthritis, left wrist: Secondary | ICD-10-CM | POA: Diagnosis not present

## 2020-04-07 DIAGNOSIS — M25532 Pain in left wrist: Secondary | ICD-10-CM | POA: Diagnosis not present

## 2020-04-09 ENCOUNTER — Encounter: Payer: Self-pay | Admitting: Family Medicine

## 2020-04-24 DIAGNOSIS — W450XXA Nail entering through skin, initial encounter: Secondary | ICD-10-CM | POA: Diagnosis not present

## 2020-04-24 DIAGNOSIS — S91331A Puncture wound without foreign body, right foot, initial encounter: Secondary | ICD-10-CM | POA: Diagnosis not present

## 2020-04-24 DIAGNOSIS — M79671 Pain in right foot: Secondary | ICD-10-CM | POA: Diagnosis not present

## 2020-04-24 DIAGNOSIS — Z23 Encounter for immunization: Secondary | ICD-10-CM | POA: Diagnosis not present

## 2020-05-03 ENCOUNTER — Encounter: Payer: Self-pay | Admitting: Surgery

## 2020-05-03 ENCOUNTER — Ambulatory Visit: Payer: Self-pay | Admitting: Surgery

## 2020-05-03 NOTE — H&P (Signed)
[name] Appointment: 05/03/2020 2:30 PM Location: [facility] Patient #: [id number] DOB: Oct 13, 1951 Single / Language: [place] / Race: White Male  History of Present Illness Billy Sportsman MD; 05/03/2020 4:17 PM) The patient is a 69 year old male who presents with an abdominal wall hernia. Note for "Abdominal hernia": ` ` ` Patient sent for surgical consultation at the request of [name]  Chief Complaint: Bulging in abdomen. Probable hernia. ` ` The patient is a gentleman no prior abdominal surgery noticed some bulging in the middle of his abdomen a few years ago. Did not know what was. He mentioned to his primary care physician. Hernia suspected. He was not having much symptoms with it. One to exercising get back to the gym. Noted some increasing discomfort though. Discuss with primary care physician. Surgical consultation offered. Patient walk several miles a day without difficulty. He did require stenting in 2008 for a LAD lesion. Has had cholesterol control and is otherwise been in good shape with no other cardiac issues. Loose his bowels once or twice a day. Does not smoke. Not diabetic.  (Review of systems as stated in this history (HPI) or in the review of systems. Otherwise all other 12 point ROS are negative) ` ` `  This patient encounter took 30 minutes today to perform the following: obtain history, perform exam, review outside records, interpret tests & imaging, counsel the patient on their diagnosis; and, document this encounter, including findings & plan in the electronic health record (EHR).   Past Surgical History [facility] Lonni Fix, CMA; 05/03/2020 2:42 PM) Anal Fissure Repair Colon Polyp Removal - Colonoscopy  Diagnostic Studies History ([name], CMA; 05/03/2020 2:42 PM) Colonoscopy 5-10 years ago  Allergies ([name], CMA; 05/03/2020 2:43 PM) Codeine Phosphate *ANALGESICS - OPIOID* Allergies  Reconciled  Medication History ([name], CMA; 05/03/2020 2:43 PM) Atorvastatin Calcium (40MG  Tablet, Oral) Active. Diclofenac Sodium (75MG  Tablet DR, Oral) Active. Medications Reconciled  Social History , CMA; 05/03/2020 2:42 PM) Alcohol use Moderate alcohol use. Caffeine use Coffee, Tea. No drug use Tobacco use Former smoker.  Family History [name], 07/04/2020; 05/03/2020 2:42 PM) Anesthetic complications [name]. Arthritis [name]. Colon Cancer Brother. Colon Polyps [name]. Heart Disease [name]. Heart disease in male family member before age 54 Hypertension [name]. Respiratory Condition [name].  Other Problems (Chanel 07/04/2020, CMA; 05/03/2020 2:42 PM) Arthritis     Review of Systems (Chanel [name]; 05/03/2020 2:42 PM) General Not Present- Appetite Loss, Chills, Fatigue, Fever, Night Sweats, Weight Gain and Weight Loss. Skin Not Present- Change in Wart/Mole, Dryness, Hives, Jaundice, New Lesions, Non-Healing Wounds, Rash and Ulcer. HEENT Present- Seasonal Allergies and Wears glasses/contact lenses. Not Present- Earache, Hearing Loss, Hoarseness, Nose Bleed, Oral Ulcers, Ringing in the Ears, Sinus Pain, Sore Throat, Visual Disturbances and Yellow Eyes. Respiratory Not Present- Bloody sputum, Chronic Cough, Difficulty Breathing, Snoring and Wheezing. Breast Not Present- Breast Mass, Breast Pain, Nipple Discharge and Skin Changes. Cardiovascular Not Present- Chest Pain, Difficulty Breathing Lying Down, Leg Cramps, Palpitations, Rapid Heart Rate, Shortness of Breath and Swelling of Extremities. Gastrointestinal Present- Abdominal Pain. Not Present- Bloating, Bloody Stool, Change in Bowel Habits, Chronic diarrhea, Constipation, Difficulty Swallowing, Excessive gas, Gets full quickly at meals, Hemorrhoids, Indigestion, Nausea, Rectal Pain and Vomiting. Male Genitourinary Not Present- Blood in Urine, Change in Urinary Stream, Frequency, Impotence, Nocturia,  Painful Urination, Urgency and Urine Leakage. Neurological Not Present- Decreased Memory, Fainting, Headaches, Numbness, Seizures, Tingling, Tremor, Trouble walking and Weakness. Psychiatric Not Present- Anxiety,  Bipolar, Change in Sleep Pattern, Depression, Fearful and Frequent crying. Endocrine Not Present- Cold Intolerance, Excessive Hunger, Hair Changes, Heat Intolerance, Hot flashes and New Diabetes. Hematology Not Present- Blood Thinners, Easy Bruising, Excessive bleeding, Gland problems, HIV and Persistent Infections.  Vitals (Chanel [name]; 05/03/2020 2:43 PM) 05/03/2020 2:43 PM Weight: 170.25 lb Height: 71in Body Surface Area: 1.97 m Body Mass Index: 23.74 kg/m  Temp.: 98.75F  Pulse: 90 (Regular)         Physical Exam Billy Sportsman MD; 05/03/2020 4:17 PM)  General Mental Status-Alert. General Appearance-Not in acute distress, Not Sickly. Orientation-Oriented X3. Hydration-Well hydrated. Voice-Normal.  Integumentary Global Assessment Upon inspection and palpation of skin surfaces of the - Axillae: non-tender, no inflammation or ulceration, no drainage. and Distribution of scalp and body hair is normal. General Characteristics Temperature - normal warmth is noted.  Head and Neck Head-normocephalic, atraumatic with no lesions or palpable masses. Face Global Assessment - atraumatic, no absence of expression. Neck Global Assessment - no abnormal movements, no bruit auscultated on the right, no bruit auscultated on the left, no decreased range of motion, non-tender. Trachea-midline. Thyroid Gland Characteristics - non-tender.  Eye Eyeball - Left-Extraocular movements intact, No Nystagmus - Left. Eyeball - Right-Extraocular movements intact, No Nystagmus - Right. Cornea - Left-No Hazy - Left. Cornea - Right-No Hazy - Right. Sclera/Conjunctiva - Left-No scleral icterus, No Discharge - Left. Sclera/Conjunctiva - Right-No  scleral icterus, No Discharge - Right. Pupil - Left-Direct reaction to light normal. Pupil - Right-Direct reaction to light normal.  ENMT Ears Pinna - Left - no drainage observed, no generalized tenderness observed. Pinna - Right - no drainage observed, no generalized tenderness observed. Nose and Sinuses External Inspection of the Nose - no destructive lesion observed. Inspection of the nares - Left - quiet respiration. Inspection of the nares - Right - quiet respiration. Mouth and Throat Lips - Upper Lip - no fissures observed, no pallor noted. Lower Lip - no fissures observed, no pallor noted. Nasopharynx - no discharge present. Oral Cavity/Oropharynx - Tongue - no dryness observed. Oral Mucosa - no cyanosis observed. Hypopharynx - no evidence of airway distress observed.  Chest and Lung Exam Inspection Movements - Normal and Symmetrical. Accessory muscles - No use of accessory muscles in breathing. Palpation Palpation of the chest reveals - Non-tender. Auscultation Breath sounds - Normal and Clear.  Cardiovascular Auscultation Rhythm - Regular. Murmurs & Other Heart Sounds - Auscultation of the heart reveals - No Murmurs and No Systolic Clicks.  Abdomen Inspection Inspection of the abdomen reveals - No Visible peristalsis and No Abnormal pulsations. Umbilicus - No Bleeding, No Urine drainage. Palpation/Percussion Palpation and Percussion of the abdomen reveal - Soft, Non Tender, No Rebound tenderness, No Rigidity (guarding) and No Cutaneous hyperesthesia. Note: Abdomen soft. Nontender. Not distended. No umbilical or incisional hernias. No guarding.  Male Genitourinary Sexual Maturity Tanner 5 - Adult hair pattern and Adult penile size and shape.  Peripheral Vascular Upper Extremity Inspection - Left - No Cyanotic nailbeds - Left, Not Ischemic. Inspection - Right - No Cyanotic nailbeds - Right, Not Ischemic.  Neurologic Neurologic evaluation reveals -normal  attention span and ability to concentrate, able to name objects and repeat phrases. Appropriate fund of knowledge , normal sensation and normal coordination. Mental Status Affect - not angry, not paranoid. Cranial Nerves-Normal Bilaterally. Gait-Normal.  Neuropsychiatric Mental status exam performed with findings of-able to articulate well with normal speech/language, rate, volume and coherence, thought content normal with ability to perform basic  computations and apply abstract reasoning and no evidence of hallucinations, delusions, obsessions or homicidal/suicidal ideation.  Musculoskeletal Global Assessment Spine, Ribs and Pelvis - no instability, subluxation or laxity. Right Upper Extremity - no instability, subluxation or laxity.  Lymphatic Head & Neck  General Head & Neck Lymphatics: Bilateral - Description - No Localized lymphadenopathy. Axillary  General Axillary Region: Bilateral - Description - No Localized lymphadenopathy. Femoral & Inguinal  Generalized Femoral & Inguinal Lymphatics: Left - Description - No Localized lymphadenopathy. Right - Description - No Localized lymphadenopathy.    Assessment & Plan Billy Sportsman MD; 05/03/2020 3:06 PM)  VENTRAL HERNIA WITHOUT OBSTRUCTION OR GANGRENE (K43.9) Impression: Pleasant active male with super umbilical ventral hernia. Seems mostly reducible. 5 cm mass get smaller.  Think he would benefit from laparoscopic exploration & repair of hernias found. Most likely underlay mesh. I don't think that he'll be a large sheet of mesh since it does not feel like a large defect nor is he morbidly obese. We will see.  He did have coronary stenting in 2008 but otherwise is in good physical shape with no other events. His cardiologist, Dr. Juanda Chance retired. He is not seeing Dr. Tenny Craw in some time. He has good cholesterol control and good blood pressure control. Very physically active. We'll double check that he is medically cleared. I'm  pretty sure that Dr. Caryl Never was okay with that since he sent the patient to me and this is not a high-risk surgery   PREOP - VWH - ENCOUNTER FOR PREOPERATIVE EXAMINATION FOR GENERAL SURGICAL PROCEDURE (Z01.818)  Current Plans You are being scheduled for surgery- Our schedulers will call you.  You should hear from our office's scheduling department within 5 working days about the location, date, and time of surgery. We try to make accommodations for patient's preferences in scheduling surgery, but sometimes the OR schedule or the surgeon's schedule prevents [place] from making those accommodations.  If you have not heard from our office [phone]) in 5 working days, call the office and ask for your surgeon's nurse.  If you have other questions about your diagnosis, plan, or surgery, call the office and ask for your surgeon's nurse.  Written instructions provided [facility] Consent - Hernia Repair - Ventral/Incisional/Umbilical (Alynah Schone): discussed with patient and provided information. Pt Education - [facility] Hernia Post-Op HCI (Fleming Prill): discussed with patient and provided information. Pt Education - [facility] Pain Control (Efrem Pitstick) Pt Education - Pamphlet Given - Laparoscopic Hernia Repair: discussed with patient and provided information. Pt Education - [facility] Mesh education: discussed with patient and provided information.  Billy Sportsman, MD, FACS, MASCRS Gastrointestinal and Minimally Invasive Surgery  [facility] [address] [place], [place] [address] [phone] Fax [phone] Main/Paging  CONTACT INFORMATION: Weekday (9AM-5PM) concerns: Call [facility] main office at [phone] Weeknight (5PM-9AM) or Weekend/Holiday concerns: Check [email or web address] for General Surgery [facility] coverage (Please, do not use SecureChat as it is not reliable communication to operating surgeons for immediate patient care)

## 2020-05-11 ENCOUNTER — Encounter: Payer: Self-pay | Admitting: Family Medicine

## 2020-05-11 ENCOUNTER — Telehealth: Payer: Self-pay

## 2020-05-11 DIAGNOSIS — I2510 Atherosclerotic heart disease of native coronary artery without angina pectoris: Secondary | ICD-10-CM

## 2020-05-11 NOTE — Telephone Encounter (Signed)
Pt informed that cardiology clearance is needed he will contact cardiology

## 2020-05-12 DIAGNOSIS — Z20822 Contact with and (suspected) exposure to covid-19: Secondary | ICD-10-CM | POA: Diagnosis not present

## 2020-05-12 NOTE — Addendum Note (Signed)
Addended by: Raiford Simmonds R on: 05/12/2020 02:40 PM   Modules accepted: Orders

## 2020-05-12 NOTE — Telephone Encounter (Signed)
Pt spoke to cardiology and he was informed that in order for him to be seen he would needs a referral. He would like to see Dr. Dietrich Pates at [facility] with [place]  Pt can be reached at [phone]

## 2020-05-12 NOTE — Telephone Encounter (Signed)
Referral placed.

## 2020-05-13 ENCOUNTER — Other Ambulatory Visit: Payer: Self-pay | Admitting: Family Medicine

## 2020-05-19 ENCOUNTER — Telehealth: Payer: Self-pay | Admitting: *Deleted

## 2020-05-19 NOTE — Telephone Encounter (Signed)
Our office received a clearance request today for the pt. Pt has not been seen since 2014 with Dr. Harrington Challenger. Pt has been scheduled as a NEW PT with Dr. Harrington Challenger 07/02/20 @ 10:20. I s/w Abigail Butts with [facility] surgery and informed her that the pt was last seen 2014 and has a NEW PT appt 07/02/20. I assured her that we will try to get the pt in sooner if we can. Abigail Butts thanked me for the call. I will place the surgery clearance request in and forward to Dr. Harrington Challenger and her nurse Caren Hazy for the upcoming appt.      [facility] Pre-operative Risk Assessment    HEARTCARE STAFF: - Please ensure there is not already an duplicate clearance open for this procedure. - Under Visit Info/Reason for Call, type in Other and utilize the format Clearance MM/DD/YY or Clearance TBD. Do not use dashes or single digits. - If request is for dental extraction, please clarify the # of teeth to be extracted.  Request for surgical clearance: PT HAS NEW PT APPT WITH DR. ROSS ON 07/02/20  What type of surgery is being performed? LAP. VENTRAL WALL HERNIA REPAIR w/MESH 1. When is this surgery scheduled? TBD   2. What type of clearance is required (medical clearance vs. Pharmacy clearance to hold med vs. Both)? MEDICAL  3. Are there any medications that need to be held prior to surgery and how long? NONE LISTED   4. Practice name and name of physician performing surgery? [facility]; DR. Remo Lipps [facility]   5. What is the office phone number? [phone]   7.   What is the office fax number? [place], CMA  8.   Anesthesia type (None, local, MAC, general) ? GENERAL   Julaine Hua 05/19/2020, 10:53 AM  _________________________________________________________________   (provider comments below)

## 2020-05-19 NOTE — Telephone Encounter (Signed)
Reviewed If he needs to be seen sooner  ? PA  on day I am there .

## 2020-05-19 NOTE — Telephone Encounter (Signed)
Printed to be addressed when patient comes for appointment.

## 2020-05-19 NOTE — Telephone Encounter (Signed)
May be able to move to 8/16 w Dr. Tenny Craw NEW PT 48 hour spot.

## 2020-06-11 ENCOUNTER — Ambulatory Visit (INDEPENDENT_AMBULATORY_CARE_PROVIDER_SITE_OTHER): Payer: Medicare Other | Admitting: Internal Medicine

## 2020-06-11 ENCOUNTER — Other Ambulatory Visit: Payer: Self-pay

## 2020-06-11 VITALS — BP 128/78 | HR 83 | Ht 71.0 in | Wt 166.2 lb

## 2020-06-11 DIAGNOSIS — I2510 Atherosclerotic heart disease of native coronary artery without angina pectoris: Secondary | ICD-10-CM

## 2020-06-11 NOTE — Patient Instructions (Signed)
Medication Instructions:  No changes *If you need a refill on your cardiac medications before your next appointment, please call your pharmacy*   Lab Work: none If you have labs (blood work) drawn today and your tests are completely normal, you will receive your results only by: . MyChart Message (if you have MyChart) OR . A paper copy in the mail If you have any lab test that is abnormal or we need to change your treatment, we will call you to review the results.   Testing/Procedures: none   Follow-Up: Your physician recommends that you schedule a follow-up appointment as needed with Dr. Ross.    Other Instructions   

## 2020-06-11 NOTE — Progress Notes (Signed)
HPI [name] is a 70 year old with a history of CAD (s/p PTCA/stent to the LAD in 2008.  I last saw him in sping 2011. Since see he has been followed by B. Burchette  I saw the pt in 2013 and he was seen again by L Gerhardt in 2014  The pt continues to do well  He denies CP  Breathing is OK   He is very active     Allergies  Allergen Reactions  . Codeine     Hives???    Current Outpatient Medications  Medication Sig Dispense Refill  . ALPHA LIPOIC ACID PO Take 100 mg by mouth daily.    Marland Kitchen atorvastatin (LIPITOR) 40 MG tablet TAKE 1 TABLET BY MOUTH EVERY DAY 90 tablet 3  . Coenzyme Q10 (COQ10 PO) Take by mouth daily.    Marland Kitchen desoximetasone (TOPICORT) 0.25 % cream APPLY TO AFFECTED AREA AS NEEDED 1-2 TIMES PER DAY 60 g 1  . diclofenac (VOLTAREN) 75 MG EC tablet TAKE 1 TABLET (75 MG TOTAL) BY MOUTH 2 (TWO) TIMES DAILY AS NEEDED FOR MILD PAIN. 180 tablet 0  . fish oil-omega-3 fatty acids 1000 MG capsule Take 1 g by mouth daily.    Marland Kitchen GLUTAMINE PO Take by mouth.    . Multiple Vitamins-Minerals (MULTIVITAMIN WITH MINERALS) tablet Take 1 tablet by mouth daily.    . Probiotic Product (PROBIOTIC-10 PO) Take by mouth daily as needed.      No current facility-administered medications for this visit.    Past Medical History:  Diagnosis Date  . Arthritis   . Blood transfusion 1953  . CAD 01/15/2009  . Coronary atherosclerosis 01/15/2009   Qualifier: Diagnosis of  By: Bascom Levels, RMA, Sherri    . DYSLIPIDEMIA 01/15/2009  . Dyslipidemia 01/15/2009   Qualifier: Diagnosis of  By: Bascom Levels, RMA, Sherri    . Glaucoma    ocular hypertention  . 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 HYPERTENSION, UNSPECIFIED 01/15/2009  . Idiopathic urticaria 07/09/2009  . Osteoarthritis of both hands 06/20/2015  . S/P coronary artery stent placement 09/16/2007   CONCLUSION:  1. Coronary artery disease with 80% proximal and ostial stenosis of      the left anterior descending artery, no significant obstruction  of      circumflex and right coronary arteries, and normal left ventricular      function.  2. Successful stenting of the lesion in the ostium of the left      anterior descending artery by using a Promus drug-eluting stent      with IVUS guidance, w    Past Surgical History:  Procedure Laterality Date  . ANAL FISTULECTOMY  2004  . CORONARY STENT PLACEMENT  2008  . WRIST FUSION  12/2009   left    Family History  Problem Relation Age of Onset  . COPD Mother   . Heart disease Father 61       CHF  . Cancer Sister 92       ?lung cancer  . Colon polyps Sister   . Cancer Brother 28       colon cancer  . Colon cancer Brother     Social History   Socioeconomic History  . Marital status: Single    Spouse name: Not on file  . Number of children: 0  . Years of education: Not on file  . Highest education level: Not on file  Occupational History  . Occupation: Airline pilot    Comment:  full time  Tobacco Use  . Smoking status: Former Smoker    Quit date: 03/23/2008    Years since quitting: 12.2  . Smokeless tobacco: Never Used  Vaping Use  . Vaping Use: Never used  Substance and Sexual Activity  . Alcohol use: Yes    Comment: 1-2 drinks a year  . Drug use: No  . Sexual activity: Not on file  Other Topics Concern  . Not on file  Social History Narrative   Lives alone with 2 dogs, one level living   [place], drives much for work   Enjoys doing Presenter, broadcasting, environmentally-conscious   Social Determinants of Health   Financial Resource Strain:   . Difficulty of Paying Living Expenses: Not on file  Food Insecurity:   . Worried About Programme researcher, broadcasting/film/video in the Last Year: Not on file  . Ran Out of Food in the Last Year: Not on file  Transportation Needs:   . Lack of Transportation (Medical): Not on file  . Lack of Transportation (Non-Medical): Not on file  Physical Activity:   . Days of Exercise per Week: Not on file  . Minutes of Exercise per Session: Not on file  Stress:   .  Feeling of Stress : Not on file  Social Connections:   . Frequency of Communication with Friends and Family: Not on file  . Frequency of Social Gatherings with Friends and Family: Not on file  . Attends Religious Services: Not on file  . Active Member of Clubs or Organizations: Not on file  . Attends Banker Meetings: Not on file  . Marital Status: Not on file  Intimate Partner Violence:   . Fear of Current or Ex-Partner: Not on file  . Emotionally Abused: Not on file  . Physically Abused: Not on file  . Sexually Abused: Not on file    Review of Systems:  All systems reviewed.  They are negative to the above problem except as previously stated.  Vital Signs: BP 128/78   Pulse 83   Ht 5\' 11"  (1.803 m)   Wt 166 lb 3.2 oz (75.4 kg)   SpO2 98%   BMI 23.18 kg/m   Physical Exam Patient is in NAD HEENT:  Normocephalic, atraumatic. EOMI, PERRLA.   Neck: JVP is normal.No bruits.   Lungs: clear to auscultation. No rales   Heart: Regular rate and rhythm. Normal S1, S2. No S3.   Nomurmurs  Abdomen:  Supple  + ventral hernia  Reduces  No masses  No hepatomegalty Extremities:   Good distal pulses throughout. No lower extremity edema.   Musculoskeletal :moving all extremities.   Neuro:   alert and oriented x3.  CN II-XII grossly intact.  EKG:  SR.  83 bpm     Assessment and Plan:  1 CAD  Pt with hx of CAD   Contines to do well   No symptoms of angina   Keep on ec ASA 81 mg 2  HL   Excellent control of LDL   66 on last check in June   HDL 91   Keep on lipitor 3  Preop eval  From a cardiac standpoint pt should do well   He is at low risk for a major cardiac event  Will be available as needed   PT has done so well  Risk factors under control     .

## 2020-07-02 ENCOUNTER — Ambulatory Visit: Payer: Medicare Other | Admitting: Internal Medicine

## 2020-07-15 ENCOUNTER — Encounter: Payer: Self-pay | Admitting: Family Medicine

## 2020-07-27 DIAGNOSIS — Z23 Encounter for immunization: Secondary | ICD-10-CM | POA: Diagnosis not present

## 2020-08-18 ENCOUNTER — Ambulatory Visit: Payer: Self-pay | Admitting: Surgery

## 2020-08-19 ENCOUNTER — Encounter (HOSPITAL_BASED_OUTPATIENT_CLINIC_OR_DEPARTMENT_OTHER): Payer: Self-pay | Admitting: Surgery

## 2020-08-19 ENCOUNTER — Other Ambulatory Visit: Payer: Self-pay

## 2020-08-19 NOTE — Progress Notes (Addendum)
Spoke w/ via phone for pre-op interview---pt Lab needs dos----  I stat 8              Lab results------ekg 06-11-2020 epic, cardiac clearance dr Tenny Craw 06-11-2020 COVID test ------08-23-1399 Arrive at -------730 am NPO after MN NO Solid Food.  Clear liquids from MN until---630 am then npo Medications to take morning of surgery -----none Diabetic medication -----n/a Patient Special Instructions --- drink --2 ensure presurgery drinks at 1000 pm night before surgery and 1 presurgery ensure drink at 630 am morning of surgery Pre-Op special Istructions -----none Patient verbalized understanding of instructions that were given at this phone interview. Patient denies shortness of breath, chest pain, fever, cough at this phone interview.

## 2020-08-23 ENCOUNTER — Other Ambulatory Visit (HOSPITAL_COMMUNITY)
Admission: RE | Admit: 2020-08-23 | Discharge: 2020-08-23 | Disposition: A | Discharge status: Home / Self Care | Payer: Medicare Other | Source: Ambulatory Visit | Attending: Surgery | Admitting: Surgery

## 2020-08-23 DIAGNOSIS — Z20822 Contact with and (suspected) exposure to covid-19: Secondary | ICD-10-CM | POA: Insufficient documentation

## 2020-08-23 DIAGNOSIS — Z01818 Encounter for other preprocedural examination: Secondary | ICD-10-CM | POA: Insufficient documentation

## 2020-08-24 LAB — SARS CORONAVIRUS 2 (TAT 6-24 HRS): SARS Coronavirus 2: NEGATIVE

## 2020-08-26 ENCOUNTER — Ambulatory Visit (HOSPITAL_BASED_OUTPATIENT_CLINIC_OR_DEPARTMENT_OTHER): Payer: Medicare Other | Admitting: Certified Registered"

## 2020-08-26 ENCOUNTER — Encounter (HOSPITAL_BASED_OUTPATIENT_CLINIC_OR_DEPARTMENT_OTHER): Payer: Self-pay | Admitting: Surgery

## 2020-08-26 ENCOUNTER — Ambulatory Visit (HOSPITAL_BASED_OUTPATIENT_CLINIC_OR_DEPARTMENT_OTHER)
Admission: RE | Admit: 2020-08-26 | Discharge: 2020-08-26 | Disposition: A | Discharge status: Home / Self Care | Payer: Medicare Other | Attending: Surgery | Admitting: Surgery

## 2020-08-26 ENCOUNTER — Other Ambulatory Visit: Payer: Self-pay

## 2020-08-26 ENCOUNTER — Encounter (HOSPITAL_BASED_OUTPATIENT_CLINIC_OR_DEPARTMENT_OTHER)
Admission: RE | Disposition: A | Discharge status: Home / Self Care | Payer: Self-pay | Source: Home / Self Care | Attending: Surgery

## 2020-08-26 DIAGNOSIS — K429 Umbilical hernia without obstruction or gangrene: Secondary | ICD-10-CM | POA: Insufficient documentation

## 2020-08-26 DIAGNOSIS — Z87891 Personal history of nicotine dependence: Secondary | ICD-10-CM | POA: Diagnosis not present

## 2020-08-26 DIAGNOSIS — E785 Hyperlipidemia, unspecified: Secondary | ICD-10-CM | POA: Insufficient documentation

## 2020-08-26 DIAGNOSIS — Z800 Family history of malignant neoplasm of digestive organs: Secondary | ICD-10-CM | POA: Diagnosis not present

## 2020-08-26 DIAGNOSIS — I2510 Atherosclerotic heart disease of native coronary artery without angina pectoris: Secondary | ICD-10-CM | POA: Diagnosis not present

## 2020-08-26 DIAGNOSIS — I10 Essential (primary) hypertension: Secondary | ICD-10-CM | POA: Diagnosis not present

## 2020-08-26 DIAGNOSIS — Z885 Allergy status to narcotic agent status: Secondary | ICD-10-CM | POA: Diagnosis not present

## 2020-08-26 DIAGNOSIS — Z8371 Family history of colonic polyps: Secondary | ICD-10-CM | POA: Insufficient documentation

## 2020-08-26 DIAGNOSIS — Z86010 Personal history of colonic polyps: Secondary | ICD-10-CM | POA: Diagnosis not present

## 2020-08-26 DIAGNOSIS — K439 Ventral hernia without obstruction or gangrene: Secondary | ICD-10-CM | POA: Diagnosis not present

## 2020-08-26 HISTORY — DX: Dermatitis, unspecified: L30.9

## 2020-08-26 HISTORY — PX: VENTRAL HERNIA REPAIR: SHX424

## 2020-08-26 HISTORY — DX: Gastro-esophageal reflux disease without esophagitis: K21.9

## 2020-08-26 LAB — POCT I-STAT, CHEM 8
BUN: 16 mg/dL (ref 8–23)
Calcium, Ion: 1.27 mmol/L (ref 1.15–1.40)
Chloride: 102 mmol/L (ref 98–111)
Creatinine, Ser: 0.7 mg/dL (ref 0.61–1.24)
Glucose, Bld: 112 mg/dL — ABNORMAL HIGH (ref 70–99)
HCT: 45 % (ref 39.0–52.0)
Hemoglobin: 15.3 g/dL (ref 13.0–17.0)
Potassium: 4.6 mmol/L (ref 3.5–5.1)
Sodium: 140 mmol/L (ref 135–145)
TCO2: 27 mmol/L (ref 22–32)

## 2020-08-26 SURGERY — REPAIR, HERNIA, VENTRAL, LAPAROSCOPIC
Anesthesia: General | Site: Abdomen | Laterality: Bilateral

## 2020-08-26 MED ORDER — FENTANYL CITRATE (PF) 100 MCG/2ML IJ SOLN
INTRAMUSCULAR | Status: AC
  Filled 2020-08-26: qty 2

## 2020-08-26 MED ORDER — HYDROMORPHONE HCL 1 MG/ML IJ SOLN: 0.2500 mg | INTRAMUSCULAR | Status: DC | PRN

## 2020-08-26 MED ORDER — CHLORHEXIDINE GLUCONATE CLOTH 2 % EX PADS: 6.0000 | MEDICATED_PAD | Freq: Once | CUTANEOUS | Status: DC

## 2020-08-26 MED ORDER — METHOCARBAMOL 500 MG PO TABS
500.0000 mg | ORAL_TABLET | Freq: Four times a day (QID) | ORAL | 1 refills | Status: DC | PRN
Start: 2020-08-26 — End: 2021-04-12

## 2020-08-26 MED ORDER — PROPOFOL 10 MG/ML IV BOLUS
INTRAVENOUS | Status: AC
  Filled 2020-08-26: qty 20

## 2020-08-26 MED ORDER — LIDOCAINE 2% (20 MG/ML) 5 ML SYRINGE
INTRAMUSCULAR | Status: DC | PRN
  Administered 2020-08-26: 100 mg via INTRAVENOUS

## 2020-08-26 MED ORDER — GABAPENTIN 300 MG PO CAPS
300.0000 mg | ORAL_CAPSULE | ORAL | Status: AC
  Administered 2020-08-26: 300 mg via ORAL

## 2020-08-26 MED ORDER — GLYCOPYRROLATE PF 0.2 MG/ML IJ SOSY
PREFILLED_SYRINGE | INTRAMUSCULAR | Status: DC | PRN
  Administered 2020-08-26: .2 mg via INTRAVENOUS

## 2020-08-26 MED ORDER — ONDANSETRON HCL 4 MG/2ML IJ SOLN
INTRAMUSCULAR | Status: AC
  Filled 2020-08-26: qty 2

## 2020-08-26 MED ORDER — CEFAZOLIN SODIUM-DEXTROSE 2-4 GM/100ML-% IV SOLN
2.0000 g | INTRAVENOUS | Status: AC
  Administered 2020-08-26: 2 g via INTRAVENOUS

## 2020-08-26 MED ORDER — ACETAMINOPHEN 500 MG PO TABS
ORAL_TABLET | ORAL | Status: AC
  Filled 2020-08-26: qty 2

## 2020-08-26 MED ORDER — KETOROLAC TROMETHAMINE 30 MG/ML IJ SOLN
INTRAMUSCULAR | Status: DC | PRN
  Administered 2020-08-26: 30 mg via INTRAVENOUS

## 2020-08-26 MED ORDER — DEXAMETHASONE SODIUM PHOSPHATE 10 MG/ML IJ SOLN
INTRAMUSCULAR | Status: AC
  Filled 2020-08-26: qty 1

## 2020-08-26 MED ORDER — ENSURE PRE-SURGERY PO LIQD: 296.0000 mL | Freq: Once | ORAL | Status: DC

## 2020-08-26 MED ORDER — MIDAZOLAM HCL 2 MG/2ML IJ SOLN
INTRAMUSCULAR | Status: AC
  Filled 2020-08-26: qty 2

## 2020-08-26 MED ORDER — BUPIVACAINE LIPOSOME 1.3 % IJ SUSP: 20.0000 mL | Freq: Once | INTRAMUSCULAR | Status: DC

## 2020-08-26 MED ORDER — DEXAMETHASONE SODIUM PHOSPHATE 10 MG/ML IJ SOLN
INTRAMUSCULAR | Status: DC | PRN
  Administered 2020-08-26 (×2): 5 mg via INTRAVENOUS

## 2020-08-26 MED ORDER — ACETAMINOPHEN 500 MG PO TABS
1000.0000 mg | ORAL_TABLET | ORAL | Status: AC
  Administered 2020-08-26: 1000 mg via ORAL

## 2020-08-26 MED ORDER — EPHEDRINE 5 MG/ML INJ
INTRAVENOUS | Status: AC
  Filled 2020-08-26: qty 10

## 2020-08-26 MED ORDER — KETOROLAC TROMETHAMINE 30 MG/ML IJ SOLN: 15.0000 mg | Freq: Once | INTRAMUSCULAR | Status: DC | PRN

## 2020-08-26 MED ORDER — GLYCOPYRROLATE PF 0.2 MG/ML IJ SOSY
PREFILLED_SYRINGE | INTRAMUSCULAR | Status: AC
  Filled 2020-08-26: qty 1

## 2020-08-26 MED ORDER — BUPIVACAINE LIPOSOME 1.3 % IJ SUSP
INTRAMUSCULAR | Status: DC | PRN
  Administered 2020-08-26: 20 mL

## 2020-08-26 MED ORDER — ONDANSETRON HCL 4 MG/2ML IJ SOLN
INTRAMUSCULAR | Status: DC | PRN
  Administered 2020-08-26: 4 mg via INTRAVENOUS

## 2020-08-26 MED ORDER — BUPIVACAINE-EPINEPHRINE 0.25% -1:200000 IJ SOLN
INTRAMUSCULAR | Status: DC | PRN
  Administered 2020-08-26: 50 mL

## 2020-08-26 MED ORDER — ENSURE PRE-SURGERY PO LIQD
592.0000 mL | Freq: Once | ORAL | Status: AC
  Administered 2020-08-26: 592 mL via ORAL

## 2020-08-26 MED ORDER — MIDAZOLAM HCL 2 MG/2ML IJ SOLN
INTRAMUSCULAR | Status: DC | PRN
  Administered 2020-08-26: 2 mg via INTRAVENOUS

## 2020-08-26 MED ORDER — EPHEDRINE SULFATE-NACL 50-0.9 MG/10ML-% IV SOSY
PREFILLED_SYRINGE | INTRAVENOUS | Status: DC | PRN
  Administered 2020-08-26 (×2): 10 mg via INTRAVENOUS

## 2020-08-26 MED ORDER — PROPOFOL 10 MG/ML IV BOLUS
INTRAVENOUS | Status: DC | PRN
  Administered 2020-08-26: 200 mg via INTRAVENOUS

## 2020-08-26 MED ORDER — SUGAMMADEX SODIUM 200 MG/2ML IV SOLN
INTRAVENOUS | Status: DC | PRN
  Administered 2020-08-26: 160 mg via INTRAVENOUS

## 2020-08-26 MED ORDER — TRAMADOL HCL 50 MG PO TABS: 50.0000 mg | ORAL_TABLET | Freq: Four times a day (QID) | ORAL | 0 refills | Status: DC | PRN

## 2020-08-26 MED ORDER — LIDOCAINE 2% (20 MG/ML) 5 ML SYRINGE
INTRAMUSCULAR | Status: AC
  Filled 2020-08-26: qty 5

## 2020-08-26 MED ORDER — CEFAZOLIN SODIUM-DEXTROSE 2-4 GM/100ML-% IV SOLN
INTRAVENOUS | Status: AC
  Filled 2020-08-26: qty 100

## 2020-08-26 MED ORDER — ROCURONIUM BROMIDE 10 MG/ML (PF) SYRINGE
PREFILLED_SYRINGE | INTRAVENOUS | Status: DC | PRN
  Administered 2020-08-26: 60 mg via INTRAVENOUS
  Administered 2020-08-26: 10 mg via INTRAVENOUS

## 2020-08-26 MED ORDER — GABAPENTIN 300 MG PO CAPS
ORAL_CAPSULE | ORAL | Status: AC
  Filled 2020-08-26: qty 1

## 2020-08-26 MED ORDER — FENTANYL CITRATE (PF) 100 MCG/2ML IJ SOLN
INTRAMUSCULAR | Status: DC | PRN
  Administered 2020-08-26 (×2): 50 ug via INTRAVENOUS
  Administered 2020-08-26: 25 ug via INTRAVENOUS

## 2020-08-26 MED ORDER — SODIUM CHLORIDE 0.9 % IR SOLN
Status: DC | PRN
  Administered 2020-08-26: 100 mL

## 2020-08-26 MED ORDER — LACTATED RINGERS IV SOLN: INTRAVENOUS | Status: DC

## 2020-08-26 SURGICAL SUPPLY — 57 items
APL PRP STRL LF DISP 70% ISPRP (MISCELLANEOUS) ×1
APPLIER CLIP 5 13 M/L LIGAMAX5 (MISCELLANEOUS)
APR CLP MED LRG 5 ANG JAW (MISCELLANEOUS)
BINDER ABDOMINAL 12 ML 46-62 (SOFTGOODS) ×2 IMPLANT
CABLE HIGH FREQUENCY MONO STRZ (ELECTRODE) ×2 IMPLANT
CANISTER SUCT 3000ML PPV (MISCELLANEOUS) ×1 IMPLANT
CHLORAPREP W/TINT 26 (MISCELLANEOUS) ×2 IMPLANT
CLIP APPLIE 5 13 M/L LIGAMAX5 (MISCELLANEOUS) IMPLANT
COVER WAND RF STERILE (DRAPES) ×2 IMPLANT
DECANTER SPIKE VIAL GLASS SM (MISCELLANEOUS) ×1 IMPLANT
DEVICE SECURE STRAP 25 ABSORB (INSTRUMENTS) ×1 IMPLANT
DEVICE TROCAR PUNCTURE CLOSURE (ENDOMECHANICALS) ×2 IMPLANT
DRAPE WARM FLUID 44X44 (DRAPES) ×2 IMPLANT
DRSG TEGADERM 2-3/8X2-3/4 SM (GAUZE/BANDAGES/DRESSINGS) ×4 IMPLANT
DRSG TEGADERM 4X4.75 (GAUZE/BANDAGES/DRESSINGS) ×2 IMPLANT
ELECT REM PT RETURN 9FT ADLT (ELECTROSURGICAL) ×2
ELECTRODE REM PT RTRN 9FT ADLT (ELECTROSURGICAL) ×1 IMPLANT
GAUZE SPONGE 4X4 12PLY STRL LF (GAUZE/BANDAGES/DRESSINGS) ×1 IMPLANT
GLOVE BIO SURGEON STRL SZ7 (GLOVE) ×2 IMPLANT
GLOVE BIOGEL PI IND STRL 6.5 (GLOVE) IMPLANT
GLOVE BIOGEL PI IND STRL 7.5 (GLOVE) IMPLANT
GLOVE BIOGEL PI INDICATOR 6.5 (GLOVE) ×2
GLOVE BIOGEL PI INDICATOR 7.5 (GLOVE) ×1
GLOVE ECLIPSE 8.0 STRL XLNG CF (GLOVE) ×2 IMPLANT
GLOVE INDICATOR 8.0 STRL GRN (GLOVE) ×2 IMPLANT
GOWN STRL REUS W/TWL XL LVL3 (GOWN DISPOSABLE) ×4 IMPLANT
IRRIG SUCT STRYKERFLOW 2 WTIP (MISCELLANEOUS) ×2
IRRIGATION SUCT STRKRFLW 2 WTP (MISCELLANEOUS) IMPLANT
KIT TURNOVER CYSTO (KITS) ×2 IMPLANT
MANIFOLD NEPTUNE II (INSTRUMENTS) ×1 IMPLANT
MESH VENTRALIGHT ST 7X9N (Mesh General) ×1 IMPLANT
NDL SPNL 22GX3.5 QUINCKE BK (NEEDLE) IMPLANT
NEEDLE HYPO 22GX1.5 SAFETY (NEEDLE) ×4 IMPLANT
NEEDLE SPNL 22GX3.5 QUINCKE BK (NEEDLE) ×2 IMPLANT
NS IRRIG 500ML POUR BTL (IV SOLUTION) ×1 IMPLANT
PACK BASIN DAY SURGERY FS (CUSTOM PROCEDURE TRAY) ×2 IMPLANT
PAD POSITIONING PINK XL (MISCELLANEOUS) ×2 IMPLANT
SCISSORS LAP 5X35 DISP (ENDOMECHANICALS) ×2 IMPLANT
SET TUBE SMOKE EVAC HIGH FLOW (TUBING) ×2 IMPLANT
SHEARS HARMONIC ACE PLUS 36CM (ENDOMECHANICALS) ×1 IMPLANT
SLEEVE ADV FIXATION 5X100MM (TROCAR) ×2 IMPLANT
SPONGE GAUZE 2X2 8PLY STRL LF (GAUZE/BANDAGES/DRESSINGS) ×4 IMPLANT
STRIP CLOSURE SKIN 1/2X4 (GAUZE/BANDAGES/DRESSINGS) ×4 IMPLANT
STRIP CLOSURE SKIN 1/4X4 (GAUZE/BANDAGES/DRESSINGS) ×1 IMPLANT
SUT MNCRL AB 4-0 PS2 18 (SUTURE) ×2 IMPLANT
SUT PDS AB 1 CT1 27 (SUTURE) ×5 IMPLANT
SUT PROLENE 1 CT 1 30 (SUTURE) ×12 IMPLANT
SUT VIC AB 2-0 SH 27 (SUTURE)
SUT VIC AB 2-0 SH 27XBRD (SUTURE) IMPLANT
SUT VIC AB 3-0 SH 27 (SUTURE)
SUT VIC AB 3-0 SH 27X BRD (SUTURE) IMPLANT
TOWEL OR 17X26 10 PK STRL BLUE (TOWEL DISPOSABLE) ×2 IMPLANT
TRAY LAPAROSCOPIC (CUSTOM PROCEDURE TRAY) ×2 IMPLANT
TROCAR ADV FIXATION 11X100MM (TROCAR) IMPLANT
TROCAR ADV FIXATION 5X100MM (TROCAR) ×3 IMPLANT
TROCAR BLADELESS OPT 5 100 (ENDOMECHANICALS) ×2 IMPLANT
WATER STERILE IRR 500ML POUR (IV SOLUTION) ×3 IMPLANT

## 2020-08-26 NOTE — Anesthesia Postprocedure Evaluation (Signed)
Anesthesia Post Note  Patient: [name]  Procedure(s) Performed: LAPAROSCOPIC VENTRAL WALL HERNIA REPAIR,   WITH MESH; TAP BLOCK BILATERAL (Bilateral Abdomen)     Patient location during evaluation: PACU Anesthesia Type: General Level of consciousness: awake and alert Pain management: pain level controlled Vital Signs Assessment: post-procedure vital signs reviewed and stable Respiratory status: spontaneous breathing, nonlabored ventilation, respiratory function stable and patient connected to nasal cannula oxygen Cardiovascular status: blood pressure returned to baseline and stable Postop Assessment: no apparent nausea or vomiting Anesthetic complications: no   No complications documented.  Last Vitals:  Vitals:   08/26/20 1015 08/26/20 1030  BP: 124/74 128/74  Pulse: 72 74  Resp: 14 15  Temp:    SpO2: 100% 96%    Last Pain:  Vitals:   08/26/20 1030  TempSrc:   PainSc: 0-No pain                 [name] S

## 2020-08-26 NOTE — Transfer of Care (Signed)
Immediate Anesthesia Transfer of Care Note  Patient: [name]  Procedure(s) Performed: Procedure(s) (LRB): LAPAROSCOPIC VENTRAL WALL HERNIA REPAIR,   WITH MESH; TAP BLOCK BILATERAL (Bilateral)  Patient Location: PACU  Anesthesia Type: General  Level of Consciousness: awake, oriented, sedated and patient cooperative  Airway & Oxygen Therapy: Patient Spontanous Breathing and Patient connected to face mask oxygen  Post-op Assessment: Report given to PACU RN and Post -op Vital signs reviewed and stable  Post vital signs: Reviewed and stable  Complications: No apparent anesthesia complications  Last Vitals:  Vitals Value Taken Time  BP 127/72 08/26/20 0941  Temp    Pulse 71 08/26/20 0943  Resp 12 08/26/20 0943  SpO2 98 % 08/26/20 0943  Vitals shown include unvalidated device data.  Last Pain:  Vitals:   08/26/20 0626  TempSrc: Oral  PainSc: 0-No pain      Patients Stated Pain Goal: 5 (08/26/20 3354)  Complications: No complications documented.

## 2020-08-26 NOTE — Anesthesia Procedure Notes (Signed)
Procedure Name: Intubation Date/Time: 08/26/2020 7:44 AM Performed by: Suan Halter, CRNA Pre-anesthesia Checklist: Patient identified, Emergency Drugs available, Suction available and Patient being monitored Patient Re-evaluated:Patient Re-evaluated prior to induction Oxygen Delivery Method: Circle system utilized Preoxygenation: Pre-oxygenation with 100% oxygen Induction Type: IV induction Ventilation: Mask ventilation without difficulty Laryngoscope Size: Mac and 4 Tube type: Oral Tube size: 7.5 mm Number of attempts: 2 Airway Equipment and Method: Stylet,  Oral airway and Video-laryngoscopy Placement Confirmation: ETT inserted through vocal cords under direct vision,  positive ETCO2 and breath sounds checked- equal and bilateral Secured at: 22 cm Tube secured with: Tape Dental Injury: Teeth and Oropharynx as per pre-operative assessment  Difficulty Due To: Difficulty was anticipated and Difficult Airway- due to anterior larynx Comments: Class 3, anterior airway - Dl x1 with MAC 4 unable to visualize cords.  Easily intubated with glidescope S4, BBS=+

## 2020-08-26 NOTE — H&P (Signed)
Jason Nest DOB: 07/12/1951  Patient Care Team: Kristian Covey, MD as PCP - General Pricilla Riffle, MD as PCP - Cardiology (Cardiology) Dominica Severin, MD as Consulting Physician (Orthopedic Surgery) Sinda Du, MD as Consulting Physician (Ophthalmology) Karie Soda, MD as Consulting Physician (General Surgery) Patient sent for surgical consultation at the request of [name]  Chief Complaint: Bulging in abdomen. Probable hernia. ` ` The patient is a gentleman no prior abdominal surgery noticed some bulging in the middle of his abdomen a few years ago. Did not know what was. He mentioned to his primary care physician. Hernia suspected. He was not having much symptoms with it. One to exercising get back to the gym. Noted some increasing discomfort though. Discuss with primary care physician. Surgical consultation offered. Patient walk several miles a day without difficulty. He did require stenting in 2008 for a LAD lesion. Has had cholesterol control and is otherwise been in good shape with no other cardiac issues. Loose his bowels once or twice a day. Does not smoke. Not diabetic.  (Review of systems as stated in this history (HPI) or in the review of systems. Otherwise all other 12 point ROS are negative) ` ` `  This patient encounter took 30 minutes today to perform the following: obtain history, perform exam, review outside records, interpret tests & imaging, counsel the patient on their diagnosis; and, document this encounter, including findings & plan in the electronic health record (EHR).   Past Surgical History [facility] Lonni Fix, CMA; 05/03/2020 2:42 PM) Anal Fissure Repair Colon Polyp Removal - Colonoscopy  Diagnostic Studies History ([name], CMA; 05/03/2020 2:42 PM) Colonoscopy 5-10 years ago  Allergies ([name], CMA; 05/03/2020 2:43 PM) Codeine Phosphate *ANALGESICS - OPIOID* Allergies Reconciled  Medication  History ([name], CMA; 05/03/2020 2:43 PM) Atorvastatin Calcium (40MG  Tablet, Oral) Active. Diclofenac Sodium (75MG  Tablet DR, Oral) Active. Medications Reconciled  Social History , CMA; 05/03/2020 2:42 PM) Alcohol use Moderate alcohol use. Caffeine use Coffee, Tea. No drug use Tobacco use Former smoker.  Family History [name], 07/04/2020; 05/03/2020 2:42 PM) Anesthetic complications Sister. Arthritis Mother. Colon Cancer Brother. Colon Polyps Sister. Heart Disease Father. Heart disease in male family member before age 58 Hypertension Mother. Respiratory Condition Mother.  Other Problems (Chanel 07/04/2020, CMA; 05/03/2020 2:42 PM) Arthritis     Review of Systems (Chanel [name]; 05/03/2020 2:42 PM) General Not Present- Appetite Loss, Chills, Fatigue, Fever, Night Sweats, Weight Gain and Weight Loss. Skin Not Present- Change in Wart/Mole, Dryness, Hives, Jaundice, New Lesions, Non-Healing Wounds, Rash and Ulcer. HEENT Present- Seasonal Allergies and Wears glasses/contact lenses. Not Present- Earache, Hearing Loss, Hoarseness, Nose Bleed, Oral Ulcers, Ringing in the Ears, Sinus Pain, Sore Throat, Visual Disturbances and Yellow Eyes. Respiratory Not Present- Bloody sputum, Chronic Cough, Difficulty Breathing, Snoring and Wheezing. Breast Not Present- Breast Mass, Breast Pain, Nipple Discharge and Skin Changes. Cardiovascular Not Present- Chest Pain, Difficulty Breathing Lying Down, Leg Cramps, Palpitations, Rapid Heart Rate, Shortness of Breath and Swelling of Extremities. Gastrointestinal Present- Abdominal Pain. Not Present- Bloating, Bloody Stool, Change in Bowel Habits, Chronic diarrhea, Constipation, Difficulty Swallowing, Excessive gas, Gets full quickly at meals, Hemorrhoids, Indigestion, Nausea, Rectal Pain and Vomiting. Male Genitourinary Not Present- Blood in Urine, Change in Urinary Stream, Frequency, Impotence, Nocturia, Painful Urination,  Urgency and Urine Leakage. Neurological Not Present- Decreased Memory, Fainting, Headaches, Numbness, Seizures, Tingling, Tremor, Trouble walking and Weakness. Psychiatric Not Present- Anxiety, Bipolar, Change in Sleep Pattern, Depression, Fearful and Frequent  crying. Endocrine Not Present- Cold Intolerance, Excessive Hunger, Hair Changes, Heat Intolerance, Hot flashes and New Diabetes. Hematology Not Present- Blood Thinners, Easy Bruising, Excessive bleeding, Gland problems, HIV and Persistent Infections.  Vitals (Chanel [name]; 05/03/2020 2:43 PM) 05/03/2020 2:43 PM Weight: 170.25 lb Height: 71in Body Surface Area: 1.97 m Body Mass Index: 23.74 kg/m  Temp.: 98.22F  Pulse: 90 (Regular)     BP (!) 150/75   Pulse 76   Temp (!) 97 F (36.1 C) (Oral)   Resp 16   Ht 5\' 10"  (1.778 m)   Wt 79.2 kg   SpO2 100%   BMI 25.05 kg/m  08/26/2020     Physical Exam 13/01/2020 MD; 05/03/2020 4:17 PM)  General Mental Status-Alert. General Appearance-Not in acute distress, Not Sickly. Orientation-Oriented X3. Hydration-Well hydrated. Voice-Normal.  Integumentary Global Assessment Upon inspection and palpation of skin surfaces of the - Axillae: non-tender, no inflammation or ulceration, no drainage. and Distribution of scalp and body hair is normal. General Characteristics Temperature - normal warmth is noted.  Head and Neck Head-normocephalic, atraumatic with no lesions or palpable masses. Face Global Assessment - atraumatic, no absence of expression. Neck Global Assessment - no abnormal movements, no bruit auscultated on the right, no bruit auscultated on the left, no decreased range of motion, non-tender. Trachea-midline. Thyroid Gland Characteristics - non-tender.  Eye Eyeball - Left-Extraocular movements intact, No Nystagmus - Left. Eyeball - Right-Extraocular movements intact, No Nystagmus - Right. Cornea - Left-No Hazy -  Left. Cornea - Right-No Hazy - Right. Sclera/Conjunctiva - Left-No scleral icterus, No Discharge - Left. Sclera/Conjunctiva - Right-No scleral icterus, No Discharge - Right. Pupil - Left-Direct reaction to light normal. Pupil - Right-Direct reaction to light normal.  ENMT Ears Pinna - Left - no drainage observed, no generalized tenderness observed. Pinna - Right - no drainage observed, no generalized tenderness observed. Nose and Sinuses External Inspection of the Nose - no destructive lesion observed. Inspection of the nares - Left - quiet respiration. Inspection of the nares - Right - quiet respiration. Mouth and Throat Lips - Upper Lip - no fissures observed, no pallor noted. Lower Lip - no fissures observed, no pallor noted. Nasopharynx - no discharge present. Oral Cavity/Oropharynx - Tongue - no dryness observed. Oral Mucosa - no cyanosis observed. Hypopharynx - no evidence of airway distress observed.  Chest and Lung Exam Inspection Movements - Normal and Symmetrical. Accessory muscles - No use of accessory muscles in breathing. Palpation Palpation of the chest reveals - Non-tender. Auscultation Breath sounds - Normal and Clear.  Cardiovascular Auscultation Rhythm - Regular. Murmurs & Other Heart Sounds - Auscultation of the heart reveals - No Murmurs and No Systolic Clicks.  Abdomen Inspection Inspection of the abdomen reveals - No Visible peristalsis and No Abnormal pulsations. Umbilicus - No Bleeding, No Urine drainage. Palpation/Percussion Palpation and Percussion of the abdomen reveal - Soft, Non Tender, No Rebound tenderness, No Rigidity (guarding) and No Cutaneous hyperesthesia. Note: Abdomen soft. Nontender. Not distended. Supraumbilical mass reducing down c/w hernia No other umbilical or incisional hernias. No guarding.  Male Genitourinary Sexual Maturity Tanner 5 - Adult hair pattern and Adult penile size and shape.  Peripheral  Vascular Upper Extremity Inspection - Left - No Cyanotic nailbeds - Left, Not Ischemic. Inspection - Right - No Cyanotic nailbeds - Right, Not Ischemic.  Neurologic Neurologic evaluation reveals -normal attention span and ability to concentrate, able to name objects and repeat phrases. Appropriate fund of knowledge , normal sensation and  normal coordination. Mental Status Affect - not angry, not paranoid. Cranial Nerves-Normal Bilaterally. Gait-Normal.  Neuropsychiatric Mental status exam performed with findings of-able to articulate well with normal speech/language, rate, volume and coherence, thought content normal with ability to perform basic computations and apply abstract reasoning and no evidence of hallucinations, delusions, obsessions or homicidal/suicidal ideation.  Musculoskeletal Global Assessment Spine, Ribs and Pelvis - no instability, subluxation or laxity. Right Upper Extremity - no instability, subluxation or laxity.  Lymphatic Head & Neck  General Head & Neck Lymphatics: Bilateral - Description - No Localized lymphadenopathy. Axillary  General Axillary Region: Bilateral - Description - No Localized lymphadenopathy. Femoral & Inguinal  Generalized Femoral & Inguinal Lymphatics: Left - Description - No Localized lymphadenopathy. Right - Description - No Localized lymphadenopathy.    Assessment & Plan VENTRAL HERNIA WITHOUT OBSTRUCTION OR GANGRENE (K43.9) Impression: Pleasant active male with supraumbilical ventral hernia. Seems mostly reducible. 5 cm mass gets smaller.  Think he would benefit from laparoscopic exploration & repair of hernias found. Most likely underlay mesh. I don't think that he'll be a large sheet of mesh since it does not feel like a large defect nor is he morbidly obese. We will see.  He did have coronary stenting in 2008 but otherwise is in good physical shape with no other events. His cardiologist, Dr. Juanda Chance retired. He  is not seeing Dr. Tenny Craw in some time. He has good cholesterol control and good blood pressure control. Very physically active. We'll double check that he is medically cleared. I'm pretty sure that Dr. Caryl Never was okay with that since he sent the patient to me and this is not a high-risk surgery   ADDENDUM:  CLEARED BY DR ROSS:  "Assessment and Plan:  1 CAD  Pt with hx of CAD   Contines to do well   No symptoms of angina   Keep on ec ASA 81 mg 2  HL   Excellent control of LDL   66 on last check in June   HDL 91   Keep on lipitor 3  Preop eval  From a cardiac standpoint pt should do well   He is at low risk for a major cardiac event"   Ardeth Sportsman, MD, FACS, MASCRS Gastrointestinal and Minimally Invasive Surgery  [facility] [address] [place], [place] [address] [phone] Fax [phone] Main/Paging  CONTACT INFORMATION: Weekday (9AM-5PM) concerns: Call [facility] main office at [phone] Weeknight (5PM-9AM) or Weekend/Holiday concerns: Check [email or web address] for General Surgery [facility] coverage (Please, do not use SecureChat as it is not reliable communication to operating surgeons for immediate patient care)

## 2020-08-26 NOTE — Anesthesia Preprocedure Evaluation (Signed)
Anesthesia Evaluation  Patient identified by MRN, date of birth, ID band Patient awake    Reviewed: Allergy & Precautions, NPO status , Patient's Chart, lab work & pertinent test results  Airway Mallampati: III  TM Distance: <3 FB Neck ROM: Limited    Dental no notable dental hx.    Pulmonary neg pulmonary ROS, former smoker,    Pulmonary exam normal breath sounds clear to auscultation       Cardiovascular hypertension, + CAD and + Cardiac Stents  Normal cardiovascular exam Rhythm:Regular Rate:Normal     Neuro/Psych negative neurological ROS  negative psych ROS   GI/Hepatic negative GI ROS, Neg liver ROS,   Endo/Other  negative endocrine ROS  Renal/GU negative Renal ROS  negative genitourinary   Musculoskeletal negative musculoskeletal ROS (+)   Abdominal   Peds negative pediatric ROS (+)  Hematology negative hematology ROS (+)   Anesthesia Other Findings   Reproductive/Obstetrics negative OB ROS                             Anesthesia Physical Anesthesia Plan  ASA: III  Anesthesia Plan: General   Post-op Pain Management:    Induction: Intravenous  PONV Risk Score and Plan: 2 and Ondansetron, Dexamethasone and Treatment may vary due to age or medical condition  Airway Management Planned: Video Laryngoscope Planned and Oral ETT  Additional Equipment:   Intra-op Plan:   Post-operative Plan: Extubation in OR  Informed Consent: I have reviewed the patients History and Physical, chart, labs and discussed the procedure including the risks, benefits and alternatives for the proposed anesthesia with the patient or authorized representative who has indicated his/her understanding and acceptance.     Dental advisory given  Plan Discussed with: CRNA and Surgeon  Anesthesia Plan Comments:         Anesthesia Quick Evaluation

## 2020-08-26 NOTE — Op Note (Signed)
08/26/2020  PATIENT:  [name]  69 y.o. male  Patient Care Team: Kristian Covey, MD as PCP - General Pricilla Riffle, MD as PCP - Cardiology (Cardiology) Dominica Severin, MD as Consulting Physician (Orthopedic Surgery) Sinda Du, MD as Consulting Physician (Ophthalmology) Karie Soda, MD as Consulting Physician (General Surgery)  PRE-OPERATIVE DIAGNOSIS:  HERNIAS IN ABDOMEN  POST-OPERATIVE DIAGNOSIS:  HERNIAS IN ABDOMEN  PROCEDURE:   LAPAROSCOPIC REPAIR OF  VENTRAL ABDOMINAL WALL HERNIA WITH MESH TAP BLOCK - BILATERAL  SURGEON:  Ardeth Sportsman, MD  ASSISTANT: Nurse   ANESTHESIA:     General  Nerve block provided with liposomal bupivacaine (Experel) mixed with 0.25% bupivacaine as a Bilateral TAP block x 44mL each side at the level of the transverse abdominis & preperitoneal spaces along the flank at the anterior axillary line, from subcostal ridge to iliac crest under laparoscopic guidance   EBL:  Total I/O In: 300 [I.V.:200; IV Piggyback:100] Out: -   Per anesthesia record  Delay start of Pharmacological VTE agent (>24hrs) due to surgical blood loss or risk of bleeding:  no  DRAINS: none   SPECIMEN:  No Specimen  DISPOSITION OF SPECIMEN:  N/A  COUNTS:  YES  PLAN OF CARE: Discharge to home after PACU  PATIENT DISPOSITION:  PACU - hemodynamically stable.  INDICATION: Pleasant patient has developed a ventral wall abdominal hernia. Recommendation was made for surgical repair  The anatomy & physiology of the abdominal wall was discussed. The pathophysiology of hernias was discussed. Natural history risks without surgery including progeressive enlargement, pain, incarceration & strangulation was discussed. Contributors to complications such as smoking, obesity, diabetes, prior surgery, etc were discussed.  I feel the risks of no intervention will lead to serious problems that outweigh the operative risks; therefore, I recommended surgery to reduce and  repair the hernia. I explained laparoscopic techniques with possible need for an open approach. I noted the probable use of mesh to patch and/or buttress the hernia repair.  Risks such as bleeding, infection, abscess, need for further treatment, heart attack, death, and other risks were discussed. I noted a good likelihood this will help address the problem. Goals of post-operative recovery were discussed as well. Possibility that this will not correct all symptoms was explained. I stressed the importance of low-impact activity, aggressive pain control, avoiding constipation, & not pushing through pain to minimize risk of post-operative chronic pain or injury. Possibility of reherniation especially with smoking, obesity, diabetes, immunosuppression, and other health conditions was discussed. We will work to minimize complications.   An educational handout further explaining the pathology & treatment options was given as well. Questions were answered. The patient expresses understanding & wishes to proceed with surgery.   OR FINDINGS: 5x4 cm periumbilical ventral wall hernia  Type of repair: Laparoscopic underlay repair.  Primary repair of largest hernia   Placement of mesh: Centrally intraperitoneal with edges tucked into RECTRORECTUS & preperitoneal space  Name of mesh: Bard Ventralight dual sided (polypropylene / Seprafilm)  Size of mesh: 23x17cm  Orientation: Transverse  Mesh overlap:  5-7cm   DESCRIPTION:   Informed consent was confirmed. The patient underwent general anaesthesia without difficulty. The patient was positioned appropriately. VTE prevention in place. The patient's abdomen was clipped, prepped, & draped in a sterile fashion. Surgical timeout confirmed our plan.  The patient was positioned in reverse Trendelenburg. Abdominal entry was gained using optical entry technique in the left upper abdomen. Entry was clean. I induced carbon dioxide insufflation. Camera inspection  revealed no injury. Extra ports were carefully placed under direct laparoscopic visualization.   I could see the hernia on the parietal peritoneum under the abdominal wall.   I did laparoscopic lysis of adhesions to expose the entire anterior abdominal wall.  I primarily used focused sharp dissection.  I freed off the falciform ligament and central peritoneum to expose the retrorectus fascia   I made sure hemostasis was good.  I mapped out the region using a needle passer.   To ensure that I would have at least 5 cm radial coverage outside of the hernia defect, I chose a 23x17cm dual sided mesh.  I placed #1 Prolene stitches around its edge about every 5 cm = 12 total.  I rolled the mesh & placed into the peritoneal cavity through the hernia defect.  I unrolled the mesh and positioned it appropriately.  I secured the mesh to cover up the hernia defect using a laparoscopic suture passer to pass the tails of the Prolene through the abdominal wall & tagged them with clamps for good transfascial suturing.  I started out in four corners to make sure I had the mesh centered under the hernia defect appropriately, and then proceeded to work in quadrants.    We evacuated CO2 & desufflated the abdomen.  I tied the fascial stitches down. I closed the fascial defect that I placed the mesh through using #1 PDS interrupted transverse stitches primarily.  I reinsufflated the abdomen. The mesh provided at least circumferential coverage around the entire region of hernia defects.  I secured the mesh centrally with an additional trans fascial stitch in & out the mesh using #1 PDS under laparoscopic visualization.   I tacked the edges & central part of the mesh to the peritoneum/posterior rectus fascia with SecureStrap absorbable tacks.   I did reinspection. Hemostasis was good. Mesh laid well. I completed a broad field block of local anesthesia at fascial stitch sites & fascial closure areas.    Capnoperitoneum was evacuated.  Ports were removed. The skin was closed with Monocryl at the port sites and Steri-Strips on the fascial stitch puncture sites.  Patient is being extubated to go to the recovery room.  I discussed operative findings, updated the patient's status, discussed probable steps to recovery, and gave postoperative recommendations to the patient's sister, Karver Fadden.   Recommendations were made.  Questions were answered.  She expressed understanding & appreciation.  Ardeth Sportsman, M.D., F.A.C.S. Gastrointestinal and Minimally Invasive Surgery [facility] [address] [place], [place] [address] [phone] Main / Paging  08/26/2020 9:31 AM

## 2020-08-26 NOTE — Discharge Instructions (Signed)
HERNIA REPAIR: POST OP INSTRUCTIONS  ######################################################################  EAT Gradually transition to a high fiber diet with a fiber supplement over the next few weeks after discharge.  Start with a pureed / full liquid diet (see below)  WALK Walk an hour a day.  Control your pain to do that.    CONTROL PAIN Control pain so that you can walk, sleep, tolerate sneezing/coughing, and go up/down stairs.  HAVE A BOWEL MOVEMENT DAILY Keep your bowels regular to avoid problems.  OK to try a laxative to override constipation.  OK to use an antidairrheal to slow down diarrhea.  Call if not better after 2 tries  CALL IF YOU HAVE PROBLEMS/CONCERNS Call if you are still struggling despite following these instructions. Call if you have concerns not answered by these instructions  ######################################################################    1. DIET: Follow a light bland diet & liquids the first 24 hours after arrival home, such as soup, liquids, starches, etc.  Be sure to drink plenty of fluids.  Quickly advance to a usual solid diet within a few days.  Avoid fast food or heavy meals as your are more likely to get nauseated or have irregular bowels.  A low-fat, high-fiber diet for the rest of your life is ideal.   2. Take your usually prescribed home medications unless otherwise directed.  3. PAIN CONTROL: a. Pain is best controlled by a usual combination of three different methods TOGETHER: i. Ice/Heat ii. Over the counter pain medication iii. Prescription pain medication b. Most patients will experience some swelling and bruising around the hernia(s) such as the bellybutton, groins, or old incisions.  Ice packs or heating pads (30-60 minutes up to 6 times a day) will help. Use ice for the first few days to help decrease swelling and bruising, then switch to heat to help relax tight/sore spots and speed recovery.  Some people prefer to use ice  alone, heat alone, alternating between ice & heat.  Experiment to what works for you.  Swelling and bruising can take several weeks to resolve.   c. It is helpful to take an over-the-counter pain medication regularly for the first few weeks.  Choose one of the following that works best for you: i. Naproxen (Aleve, etc)  Two 244m tabs twice a day ii. Ibuprofen (Advil, etc) Three 2036mtabs four times a day (every meal & bedtime) iii. Acetaminophen (Tylenol, etc) 325-65016mour times a day (every meal & bedtime) d. A  prescription for pain medication should be given to you upon discharge.  Take your pain medication as prescribed.  i. If you are having problems/concerns with the prescription medicine (does not control pain, nausea, vomiting, rash, itching, etc), please call us [phone] see if we need to switch you to a different pain medicine that will work better for you and/or control your side effect better. ii. If you need a refill on your pain medication, please contact your pharmacy.  They will contact our office to request authorization. Prescriptions will not be filled after 5 pm or on week-ends.  4. Avoid getting constipated.  Between the surgery and the pain medications, it is common to experience some constipation.  Increasing fluid intake and taking a fiber supplement (such as Metamucil, Citrucel, FiberCon, MiraLax, etc) 1-2 times a day regularly will usually help prevent this problem from occurring.  A mild laxative (prune juice, Milk of Magnesia, MiraLax, etc) should be taken according to package directions if there are no bowel movements after 48  hours.    5. Wash / shower every day.  You may shower over the dressings as they are waterproof.    6. Remove your waterproof bandages, skin tapes, and other bandages 3 days after surgery. You may replace a dressing/Band-Aid to cover the incision for comfort if you wish. You may leave the incisions open to air.  You may replace a  dressing/Band-Aid to cover an incision for comfort if you wish.  Continue to shower over incision(s) after the dressing is off.  7. ACTIVITIES as tolerated:   a. You may resume regular (light) daily activities beginning the next day--such as daily self-care, walking, climbing stairs--gradually increasing activities as tolerated.  Control your pain so that you can walk an hour a day.  If you can walk 30 minutes without difficulty, it is safe to try more intense activity such as jogging, treadmill, bicycling, low-impact aerobics, swimming, etc. b. Save the most intensive and strenuous activity for last such as sit-ups, heavy lifting, contact sports, etc  Refrain from any heavy lifting or straining until you are off narcotics for pain control.   c. DO NOT PUSH THROUGH PAIN.  Let pain be your guide: If it hurts to do something, don't do it.  Pain is your body warning you to avoid that activity for another week until the pain goes down. d. You may drive when you are no longer taking prescription pain medication, you can comfortably wear a seatbelt, and you can safely maneuver your car and apply brakes. e. Bonita Quin may have sexual intercourse when it is comfortable.   8. FOLLOW UP in our office a. Please call [facility] at [phone] to set up an appointment to see your surgeon in the office for a follow-up appointment approximately 2-3 weeks after your surgery. b. Make sure that you call for this appointment the day you arrive home to insure a convenient appointment time.  9.  If you have disability of FMLA / Family leave forms, please bring the forms to the office for processing.  (do not give to your surgeon).  WHEN TO CALL us [phone]: 1. Poor pain control 2. Reactions / problems with new medications (rash/itching, nausea, etc)  3. Fever over 101.5 F (38.5 C) 4. Inability to urinate 5. Nausea and/or vomiting 6. Worsening swelling or bruising 7. Continued bleeding from incision. 8. Increased pain,  redness, or drainage from the incision   The clinic staff is available to answer your questions during regular business hours (8:30am-5pm).  Please don't hesitate to call and ask to speak to one of our nurses for clinical concerns.   If you have a medical emergency, go to the nearest emergency room or call 911.  A surgeon from [facility] is always on call at the hospitals in [facility], [place] [address], [address], [place], [place]  [address] ?  [address] [address], [place], [place]   [address] MAIN: [phone] ? TOLL FREE: [phone] ? FAX: [phone] [email or web address]    Post Anesthesia Home Care Instructions  Activity: Get plenty of rest for the remainder of the day. A responsible individual must stay with you for 24 hours following the procedure.  For the next 24 hours, DO NOT: -Drive a car -Advertising copywriter -Drink alcoholic beverages -Take any medication unless instructed by your physician -Make any legal decisions or sign important papers.  Meals: Start with liquid foods such as gelatin or soup. Progress to regular foods as tolerated. Avoid  greasy, spicy, heavy foods. If nausea and/or vomiting occur, drink only clear liquids until the nausea and/or vomiting subsides. Call your physician if vomiting continues.  Special Instructions/Symptoms: Your throat may feel dry or sore from the anesthesia or the breathing tube placed in your throat during surgery. If this causes discomfort, gargle with warm salt water. The discomfort should disappear within 24 hours.    Information for Discharge Teaching: EXPAREL (bupivacaine liposome injectable suspension)   Your surgeon or anesthesiologist gave you EXPAREL(bupivacaine) to help control your pain after surgery.   EXPAREL is a local anesthetic that provides pain relief by numbing the tissue around the surgical site.  EXPAREL is designed to release pain medication over time and  can control pain for up to 72 hours.  Depending on how you respond to EXPAREL, you may require less pain medication during your recovery.  Possible side effects:  Temporary loss of sensation or ability to move in the area where bupivacaine was injected.  Nausea, vomiting, constipation  Rarely, numbness and tingling in your mouth or lips, lightheadedness, or anxiety may occur.  Call your doctor right away if you think you may be experiencing any of these sensations, or if you have other questions regarding possible side effects.  Follow all other discharge instructions given to you by your surgeon or nurse. Eat a healthy diet and drink plenty of water or other fluids.  If you return to the hospital for any reason within 96 hours following the administration of EXPAREL, it is important for health care providers to know that you have received this anesthetic. A teal colored band has been placed on your arm with the date, time and amount of EXPAREL you have received in order to alert and inform your health care providers. Please leave this armband in place for the full 96 hours following administration, and then you may remove the band.   Remove green armband on Monday, August 30, 2020.

## 2020-08-27 ENCOUNTER — Encounter (HOSPITAL_BASED_OUTPATIENT_CLINIC_OR_DEPARTMENT_OTHER): Payer: Self-pay | Admitting: Surgery

## 2020-09-28 DIAGNOSIS — Z23 Encounter for immunization: Secondary | ICD-10-CM | POA: Diagnosis not present

## 2020-11-10 ENCOUNTER — Other Ambulatory Visit: Payer: Self-pay | Admitting: Family Medicine

## 2020-12-21 ENCOUNTER — Telehealth: Payer: Self-pay | Admitting: Family Medicine

## 2020-12-21 NOTE — Progress Notes (Signed)
  Chronic Care Management   Outreach Note  12/21/2020 Name: [name] MRN: [id number] DOB: 02-25-1951  Referred by: Kristian Covey, MD Reason for referral : No chief complaint on file.   An unsuccessful telephone outreach was attempted today. The patient was referred to the pharmacist for assistance with care management and care coordination.   Follow Up Plan:   Carley Perdue [employer] Scheduler

## 2021-02-21 ENCOUNTER — Other Ambulatory Visit: Payer: Self-pay

## 2021-02-21 ENCOUNTER — Ambulatory Visit (INDEPENDENT_AMBULATORY_CARE_PROVIDER_SITE_OTHER): Payer: Medicare Other | Admitting: Family Medicine

## 2021-02-21 ENCOUNTER — Encounter: Payer: Self-pay | Admitting: Family Medicine

## 2021-02-21 VITALS — BP 134/80 | HR 75 | Temp 97.5°F | Wt 175.4 lb

## 2021-02-21 DIAGNOSIS — H6123 Impacted cerumen, bilateral: Secondary | ICD-10-CM

## 2021-02-21 NOTE — Progress Notes (Signed)
Established Patient Office Visit  Subjective:  Patient ID: [name], male    DOB: [birth year]  Age: 70 y.o. MRN: [id number]  CC:  Chief Complaint  Patient presents with  . Ear Fullness    HPI [name] presents for bilateral ear fullness.  He states he usually has to have his ears cleaned once per year secondary to cerumen.  Friday he woke up with sudden sensation of occlusion of the left ear followed by the right on Saturday.  No ear drainage.  No ear pain.  No dizziness.  He has not applied any drops himself. Denies any vertigo.  Past Medical History:  Diagnosis Date  . Arthritis   . Blood transfusion 1953  . CAD 01/15/2009  . Coronary atherosclerosis 01/15/2009   Qualifier: Diagnosis of  By: Bascom Levels, RMA, Sherri    . DYSLIPIDEMIA 01/15/2009  . Dyslipidemia 01/15/2009   Qualifier: Diagnosis of  By: Bascom Levels, RMA, Sherri    . Eczema   . GERD (gastroesophageal reflux disease)   . Glaucoma    ocular hypertention  . 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 HYPERTENSION, UNSPECIFIED 01/15/2009  . Idiopathic urticaria 07/09/2009  . Osteoarthritis of both hands 06/20/2015  . S/P coronary artery stent placement 09/16/2007   CONCLUSION:  1. Coronary artery disease with 80% proximal and ostial stenosis of      the left anterior descending artery, no significant obstruction of      circumflex and right coronary arteries, and normal left ventricular      function.  2. Successful stenting of the lesion in the ostium of the left      anterior descending artery by using a Promus drug-eluting stent      with IVUS guidance, w    Past Surgical History:  Procedure Laterality Date  . ANAL FISTULECTOMY  2004  . CORONARY STENT PLACEMENT  2008  . VENTRAL HERNIA REPAIR Bilateral 08/26/2020   Procedure: LAPAROSCOPIC VENTRAL WALL HERNIA REPAIR,   WITH MESH; TAP BLOCK BILATERAL;  Surgeon: Karie Soda, MD;  Location: [facility];  Service: General;  Laterality:  Bilateral;  . WRIST FUSION  12/2009   left    Family History  Problem Relation Age of Onset  . COPD Mother   . Heart disease Father 72       CHF  . Cancer Sister 52       ?lung cancer  . Colon polyps Sister   . Cancer Brother 76       colon cancer  . Colon cancer Brother     Social History   Socioeconomic History  . Marital status: Single    Spouse name: Not on file  . Number of children: 0  . Years of education: Not on file  . Highest education level: Not on file  Occupational History  . Occupation: Airline pilot    Comment: full time  Tobacco Use  . Smoking status: Former Smoker    Packs/day: 0.50    Years: 35.00    Pack years: 17.50    Types: Cigarettes    Quit date: 03/23/2008    Years since quitting: 12.9  . Smokeless tobacco: Never Used  Vaping Use  . Vaping Use: Never used  Substance and Sexual Activity  . Alcohol use: Yes    Comment: 1-2 drinks a year  . Drug use: No  . Sexual activity: Not on file  Other Topics Concern  . Not on file  Social  History Narrative   Lives alone with 2 dogs, one level living   [place], drives much for work   Enjoys doing Presenter, broadcasting, environmentally-conscious   Social Determinants of Corporate investment banker Strain: Not on file  Food Insecurity: Not on file  Transportation Needs: Not on file  Physical Activity: Not on file  Stress: Not on file  Social Connections: Not on file  Intimate Partner Violence: Not on file    Outpatient Medications Prior to Visit  Medication Sig Dispense Refill  . ALPHA LIPOIC ACID PO Take 100 mg by mouth daily.    Marland Kitchen aspirin EC 81 MG tablet Take 81 mg by mouth daily. Swallow whole. Pt stopped pt preference    . atorvastatin (LIPITOR) 40 MG tablet TAKE 1 TABLET BY MOUTH EVERY DAY (Patient taking differently: at bedtime. ) 90 tablet 3  . Coenzyme Q10 (COQ10 PO) Take by mouth daily.    Marland Kitchen desoximetasone (TOPICORT) 0.25 % cream APPLY TO AFFECTED AREA AS NEEDED 1-2 TIMES PER DAY 60 g 1  . diclofenac  (VOLTAREN) 75 MG EC tablet TAKE 1 TABLET (75 MG TOTAL) BY MOUTH 2 (TWO) TIMES DAILY AS NEEDED FOR MILD PAIN. 180 tablet 0  . famotidine (PEPCID) 20 MG tablet Take 20 mg by mouth 2 (two) times daily.    . fish oil-omega-3 fatty acids 1000 MG capsule Take 1 g by mouth daily.    . Fluticasone Propionate (FLONASE NA) Place into the nose 2 (two) times daily as needed.    . magnesium citrate SOLN Take 1 Bottle by mouth daily.    . methocarbamol (ROBAXIN) 500 MG tablet Take 1 tablet (500 mg total) by mouth every 6 (six) hours as needed (use for muscle cramps/pain). 20 tablet 1  . OVER THE COUNTER MEDICATION asthan 10 mg daily    . OVER THE COUNTER MEDICATION dmar  150 mg daily    . OVER THE COUNTER MEDICATION Mushroom extract 2 caps daily    . OVER THE COUNTER MEDICATION Green tea 1 daily    . OVER THE COUNTER MEDICATION bioperine (black pepper) 1 cap 10 mg daily    . OVER THE COUNTER MEDICATION     . OVER THE COUNTER MEDICATION hrmp oil     1000 mg ( 1 dropper daily)    . Probiotic Product (PROBIOTIC-10 PO) Take by mouth daily.     . saw palmetto 160 MG capsule Take 160 mg by mouth 2 (two) times daily.    . traMADol (ULTRAM) 50 MG tablet Take 1-2 tablets (50-100 mg total) by mouth every 6 (six) hours as needed for moderate pain or severe pain. 20 tablet 0   No facility-administered medications prior to visit.    Allergies  Allergen Reactions  . Codeine     Hives???    ROS Review of Systems  Constitutional: Negative for chills and fever.  HENT: Positive for hearing loss. Negative for ear discharge and ear pain.   Neurological: Negative for dizziness.      Objective:    Physical Exam Vitals reviewed.  Constitutional:      Appearance: Normal appearance.  HENT:     Ears:     Comments: Bilateral cerumen impactions. Right canal was not totally occluded.  We were able to remove this with a curette and he tolerated well. Neurological:     Mental Status: He is alert.     BP 134/80  (BP Location: Left Arm, Patient Position: Sitting, Cuff Size: Normal)  Pulse 75   Temp (!) 97.5 F (36.4 C) (Oral)   Wt 175 lb 6.4 oz (79.6 kg)   SpO2 97%   BMI 25.17 kg/m  Wt Readings from Last 3 Encounters:  02/21/21 175 lb 6.4 oz (79.6 kg)  08/26/20 174 lb 9.6 oz (79.2 kg)  06/11/20 166 lb 3.2 oz (75.4 kg)     Health Maintenance Due  Topic Date Due  . COLONOSCOPY (Pts 45-35yrs Insurance coverage will need to be confirmed)  02/08/2017  . COVID-19 Vaccine (3 - Booster for Pfizer series) 07/09/2020    There are no preventive care reminders to display for this patient.  Lab Results  Component Value Date   TSH 1.23 06/16/2015   Lab Results  Component Value Date   WBC 5.6 06/16/2015   HGB 15.3 08/26/2020   HCT 45.0 08/26/2020   MCV 90.3 06/16/2015   PLT 212.0 06/16/2015   Lab Results  Component Value Date   NA 140 08/26/2020   K 4.6 08/26/2020   CO2 27 03/26/2020   GLUCOSE 112 (H) 08/26/2020   BUN 16 08/26/2020   CREATININE 0.70 08/26/2020   BILITOT 0.8 03/26/2020   ALKPHOS 64 03/26/2020   AST 28 03/26/2020   ALT 21 03/26/2020   PROT 7.0 03/26/2020   ALBUMIN 4.7 03/26/2020   CALCIUM 9.5 03/26/2020   GFR 103.02 03/26/2020   Lab Results  Component Value Date   CHOL 165 03/26/2020   Lab Results  Component Value Date   HDL 90.70 03/26/2020   Lab Results  Component Value Date   LDLCALC 66 03/26/2020   Lab Results  Component Value Date   TRIG 38.0 03/26/2020   Lab Results  Component Value Date   CHOLHDL 2 03/26/2020   No results found for: HGBA1C    Assessment & Plan:   Bilateral cerumen impactions.  This has been a recurrent process for him.  We are able to remove cerumen with a curette from the right canal but not the left as this was more packed down near the eardrum.  Patient aware of risk of irrigation of ear canal including risk of bleeding, pain, low risk of eardrum perforation and consents.  Irrigated left ear canal.  This was done after  placing some hydrogen peroxide in the ear canal to sit for several minutes.  He did have a little bit of very mild bleeding from the external canal but the wax was eventually cleared completely with irrigation and patient tolerated well.  Eardrum appears normal.  He does have some mild erythema of the canal where the wax was but no other acute abnormalities.  Patient could hear better afterwards.  No orders of the defined types were placed in this encounter.   Follow-up: No follow-ups on file.    Evelena Peat, MD

## 2021-02-22 ENCOUNTER — Ambulatory Visit: Payer: Medicare Other | Admitting: Family Medicine

## 2021-03-08 ENCOUNTER — Ambulatory Visit (INDEPENDENT_AMBULATORY_CARE_PROVIDER_SITE_OTHER): Payer: Medicare Other | Admitting: Family Medicine

## 2021-03-08 ENCOUNTER — Other Ambulatory Visit: Payer: Self-pay

## 2021-03-08 ENCOUNTER — Encounter: Payer: Self-pay | Admitting: Family Medicine

## 2021-03-08 VITALS — BP 142/70 | HR 60 | Temp 97.5°F | Ht 70.0 in | Wt 175.4 lb

## 2021-03-08 DIAGNOSIS — E785 Hyperlipidemia, unspecified: Secondary | ICD-10-CM | POA: Diagnosis not present

## 2021-03-08 DIAGNOSIS — Z125 Encounter for screening for malignant neoplasm of prostate: Secondary | ICD-10-CM | POA: Diagnosis not present

## 2021-03-08 DIAGNOSIS — Z0000 Encounter for general adult medical examination without abnormal findings: Secondary | ICD-10-CM

## 2021-03-08 DIAGNOSIS — I2510 Atherosclerotic heart disease of native coronary artery without angina pectoris: Secondary | ICD-10-CM | POA: Diagnosis not present

## 2021-03-08 LAB — BASIC METABOLIC PANEL
BUN: 17 mg/dL (ref 6–23)
CO2: 28 mEq/L (ref 19–32)
Calcium: 9.8 mg/dL (ref 8.4–10.5)
Chloride: 103 mEq/L (ref 96–112)
Creatinine, Ser: 0.85 mg/dL (ref 0.40–1.50)
GFR: 87.89 mL/min (ref >60.00)
Glucose, Bld: 95 mg/dL (ref 70–99)
Potassium: 4.9 mEq/L (ref 3.5–5.1)
Sodium: 139 mEq/L (ref 135–145)

## 2021-03-08 LAB — LIPID PANEL
Cholesterol: 152 mg/dL (ref 0–200)
HDL: 71.7 mg/dL (ref >39.00)
LDL Cholesterol: 72 mg/dL (ref 0–99)
NonHDL: 79.81
Total CHOL/HDL Ratio: 2
Triglycerides: 40 mg/dL (ref 0.0–149.0)
VLDL: 8 mg/dL (ref 0.0–40.0)

## 2021-03-08 LAB — HEPATIC FUNCTION PANEL
ALT: 30 U/L (ref 0–53)
AST: 28 U/L (ref 0–37)
Albumin: 4.9 g/dL (ref 3.5–5.2)
Alkaline Phosphatase: 59 U/L (ref 39–117)
Bilirubin, Direct: 0.2 mg/dL (ref 0.0–0.3)
Total Bilirubin: 0.9 mg/dL (ref 0.2–1.2)
Total Protein: 7.4 g/dL (ref 6.0–8.3)

## 2021-03-08 LAB — PSA, MEDICARE: PSA: 1.52 ng/ml (ref 0.10–4.00)

## 2021-03-08 NOTE — Progress Notes (Signed)
Established Patient Office Visit  Subjective:  Patient ID: [name], male    DOB: [birth year]  Age: 70 y.o. MRN: [id number]  CC:  Chief Complaint  Patient presents with  . Annual Exam    HPI [name] presents for Medicare subsequent annual wellness visit and medical follow-up.  He does have history of CAD with stent placement way back in 2008.  He apparently took himself off aspirin during the past year after hearing some things in the media about no aspirin use after age 71.  He is overdue for colonoscopy but declines setting up referral yet at this time.  Has had some BPH symptoms in the past and started some type of over-the-counter supplement and feels that his symptoms are improved.  Apparently his supplement contains saw palmetto.  1.  Risk factors based on Past Medical , Social, and Family history reviewed and as indicated above with no changes  See below  2.  Limitations in physical activities None.  No recent falls.  Good balance.  Exercises regularly.  3.  Depression/mood No active depression or anxiety issues.  PHQ 2 equals 0  4.  Hearing No major deficits  5.  ADLs independent in all.  6.  Cognitive function (orientation to time and place, language, writing, speech,memory) no short or long term memory issues.  Language and judgement intact.  7.  Home Safety no issues  8.  Height, weight, and visual acuity.all stable.  Weight has changed very little in recent years.  Height stable.  Gets regular eye checks. Wt Readings from Last 3 Encounters:  03/08/21 175 lb 6.4 oz (79.6 kg)  02/21/21 175 lb 6.4 oz (79.6 kg)  08/26/20 174 lb 9.6 oz (79.2 kg)    9.  Counseling discussed -Counseled regarding age and gender appropriate preventative screenings and immunizations.  10. Recommendation of preventive services.  Discussed pros and cons of PSA screening.  He wishes to go ahead and continue checking PSA.  We reviewed [place] preventive task force  recommendations regarding PSA screening after 70. -Would recommend continued annual flu vaccine -Needs follow-up colonoscopy and he is not interested in screening quite yet  11. Labs based on risk factors-lipid, hepatic, basic metabolic panel  12. Care Plan-as below  13. Other Providers-Dr. Tenny Crawoss cardiology, Dr. Michaell CowingGross, general surgery, Dr. Cathey EndowBowen ophthalmology  14. Written schedule of screening/prevention services given to patient.  Health Maintenance  Topic Date Due  . COLONOSCOPY (Pts 45-1566yrs Insurance coverage will need to be confirmed)  02/08/2017  . COVID-19 Vaccine (3 - Booster for Pfizer series) 06/08/2020  . INFLUENZA VACCINE  05/23/2021  . TETANUS/TDAP  04/24/2030  . Hepatitis C Screening  Completed  . PNA vac Low Risk Adult  Completed  . HPV VACCINES  Aged Out     Does remain on Lipitor for hyperlipidemia.  He continues to exercise regularly.  No recent chest pains.  Past Medical History:  Diagnosis Date  . Arthritis   . Blood transfusion 1953  . CAD 01/15/2009  . Coronary atherosclerosis 01/15/2009   Qualifier: Diagnosis of  By: Bascom LevelsFrazier, RMA, Sherri    . DYSLIPIDEMIA 01/15/2009  . Dyslipidemia 01/15/2009   Qualifier: Diagnosis of  By: Bascom LevelsFrazier, RMA, Sherri    . Eczema   . GERD (gastroesophageal reflux disease)   . Glaucoma    ocular hypertention  . 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 HYPERTENSION, UNSPECIFIED 01/15/2009  . Idiopathic urticaria 07/09/2009  . Osteoarthritis of both  hands 06/20/2015  . S/P coronary artery stent placement 09/16/2007   CONCLUSION:  1. Coronary artery disease with 80% proximal and ostial stenosis of      the left anterior descending artery, no significant obstruction of      circumflex and right coronary arteries, and normal left ventricular      function.  2. Successful stenting of the lesion in the ostium of the left      anterior descending artery by using a Promus drug-eluting stent      with IVUS guidance, w    Past  Surgical History:  Procedure Laterality Date  . ANAL FISTULECTOMY  2004  . CORONARY STENT PLACEMENT  2008  . VENTRAL HERNIA REPAIR Bilateral 08/26/2020   Procedure: LAPAROSCOPIC VENTRAL WALL HERNIA REPAIR,   WITH MESH; TAP BLOCK BILATERAL;  Surgeon: Karie Soda, MD;  Location: [facility];  Service: General;  Laterality: Bilateral;  . WRIST FUSION  12/2009   left    Family History  Problem Relation Age of Onset  . COPD Mother   . Heart disease Father 6       CHF  . Cancer Sister 58       ?lung cancer  . Colon polyps Sister   . Cancer Brother 80       colon cancer  . Colon cancer Brother     Social History   Socioeconomic History  . Marital status: Single    Spouse name: Not on file  . Number of children: 0  . Years of education: Not on file  . Highest education level: Not on file  Occupational History  . Occupation: Airline pilot    Comment: full time  Tobacco Use  . Smoking status: Former Smoker    Packs/day: 0.50    Years: 35.00    Pack years: 17.50    Types: Cigarettes    Quit date: 03/23/2008    Years since quitting: 12.9  . Smokeless tobacco: Never Used  Vaping Use  . Vaping Use: Never used  Substance and Sexual Activity  . Alcohol use: Yes    Comment: 1-2 drinks a year  . Drug use: No  . Sexual activity: Not on file  Other Topics Concern  . Not on file  Social History Narrative   Lives alone with 2 dogs, one level living   [place], drives much for work   Enjoys doing Presenter, broadcasting, environmentally-conscious   Social Determinants of Corporate investment banker Strain: Not on file  Food Insecurity: Not on file  Transportation Needs: Not on file  Physical Activity: Not on file  Stress: Not on file  Social Connections: Not on file  Intimate Partner Violence: Not on file    Outpatient Medications Prior to Visit  Medication Sig Dispense Refill  . ALPHA LIPOIC ACID PO Take 100 mg by mouth daily.    Marland Kitchen aspirin EC 81 MG tablet Take 81 mg by mouth  daily. Swallow whole. Pt stopped pt preference    . atorvastatin (LIPITOR) 40 MG tablet TAKE 1 TABLET BY MOUTH EVERY DAY (Patient taking differently: at bedtime.) 90 tablet 3  . Coenzyme Q10 (COQ10 PO) Take by mouth daily.    Marland Kitchen desoximetasone (TOPICORT) 0.25 % cream APPLY TO AFFECTED AREA AS NEEDED 1-2 TIMES PER DAY 60 g 1  . diclofenac (VOLTAREN) 75 MG EC tablet TAKE 1 TABLET (75 MG TOTAL) BY MOUTH 2 (TWO) TIMES DAILY AS NEEDED FOR MILD PAIN. 180 tablet 0  . famotidine (PEPCID)  20 MG tablet Take 20 mg by mouth 2 (two) times daily.    . fish oil-omega-3 fatty acids 1000 MG capsule Take 1 g by mouth daily.    . Fluticasone Propionate (FLONASE NA) Place into the nose 2 (two) times daily as needed.    . magnesium citrate SOLN Take 1 Bottle by mouth daily.    . methocarbamol (ROBAXIN) 500 MG tablet Take 1 tablet (500 mg total) by mouth every 6 (six) hours as needed (use for muscle cramps/pain). 20 tablet 1  . OVER THE COUNTER MEDICATION asthan 10 mg daily    . OVER THE COUNTER MEDICATION dmar  150 mg daily    . OVER THE COUNTER MEDICATION Mushroom extract 2 caps daily    . OVER THE COUNTER MEDICATION Green tea 1 daily    . OVER THE COUNTER MEDICATION bioperine (black pepper) 1 cap 10 mg daily    . OVER THE COUNTER MEDICATION     . OVER THE COUNTER MEDICATION hrmp oil     1000 mg ( 1 dropper daily)    . Probiotic Product (PROBIOTIC-10 PO) Take by mouth daily.     . saw palmetto 160 MG capsule Take 160 mg by mouth 2 (two) times daily.    . traMADol (ULTRAM) 50 MG tablet Take 1-2 tablets (50-100 mg total) by mouth every 6 (six) hours as needed for moderate pain or severe pain. 20 tablet 0   No facility-administered medications prior to visit.    Allergies  Allergen Reactions  . Codeine     Hives???    ROS Review of Systems  Constitutional: Negative for fatigue.  Eyes: Negative for visual disturbance.  Respiratory: Negative for cough, chest tightness and shortness of breath.    Cardiovascular: Negative for chest pain, palpitations and leg swelling.  Neurological: Negative for dizziness, syncope, weakness, light-headedness and headaches.      Objective:    Physical Exam Constitutional:      Appearance: He is well-developed.  HENT:     Right Ear: External ear normal.     Left Ear: External ear normal.  Eyes:     Pupils: Pupils are equal, round, and reactive to light.  Neck:     Thyroid: No thyromegaly.  Cardiovascular:     Rate and Rhythm: Normal rate and regular rhythm.  Pulmonary:     Effort: Pulmonary effort is normal. No respiratory distress.     Breath sounds: Normal breath sounds. No wheezing or rales.  Musculoskeletal:     Cervical back: Neck supple.  Neurological:     Mental Status: He is alert and oriented to person, place, and time.     BP (!) 142/70 (BP Location: Left Arm, Patient Position: Sitting, Cuff Size: Normal)   Pulse 60   Temp (!) 97.5 F (36.4 C) (Oral)   Ht 5\' 10"  (1.778 m)   Wt 175 lb 6.4 oz (79.6 kg)   SpO2 97%   BMI 25.17 kg/m  Wt Readings from Last 3 Encounters:  03/08/21 175 lb 6.4 oz (79.6 kg)  02/21/21 175 lb 6.4 oz (79.6 kg)  08/26/20 174 lb 9.6 oz (79.2 kg)     Health Maintenance Due  Topic Date Due  . COLONOSCOPY (Pts 45-44yrs Insurance coverage will need to be confirmed)  02/08/2017  . COVID-19 Vaccine (3 - Booster for Pfizer series) 06/08/2020    There are no preventive care reminders to display for this patient.  Lab Results  Component Value Date   TSH  1.23 06/16/2015   Lab Results  Component Value Date   WBC 5.6 06/16/2015   HGB 15.3 08/26/2020   HCT 45.0 08/26/2020   MCV 90.3 06/16/2015   PLT 212.0 06/16/2015   Lab Results  Component Value Date   NA 140 08/26/2020   K 4.6 08/26/2020   CO2 27 03/26/2020   GLUCOSE 112 (H) 08/26/2020   BUN 16 08/26/2020   CREATININE 0.70 08/26/2020   BILITOT 0.8 03/26/2020   ALKPHOS 64 03/26/2020   AST 28 03/26/2020   ALT 21 03/26/2020   PROT 7.0  03/26/2020   ALBUMIN 4.7 03/26/2020   CALCIUM 9.5 03/26/2020   GFR 103.02 03/26/2020   Lab Results  Component Value Date   CHOL 165 03/26/2020   Lab Results  Component Value Date   HDL 90.70 03/26/2020   Lab Results  Component Value Date   LDLCALC 66 03/26/2020   Lab Results  Component Value Date   TRIG 38.0 03/26/2020   Lab Results  Component Value Date   CHOLHDL 2 03/26/2020   No results found for: HGBA1C    Assessment & Plan:   #1 Medicare subsequent annual wellness visit  -Health maintenance reviewed.  Recommend follow-up colonoscopy but he declined setting up quite at this time.  Continue annual flu vaccine. Other vaccines up-to-date  -The natural history of prostate cancer and ongoing controversy regarding screening and potential treatment outcomes of prostate cancer has been discussed with the patient. The meaning of a false positive PSA and a false negative PSA has been discussed. He indicates understanding of the limitations of this screening test and wishes to proceed with screening PSA testing.  #2 hyperlipidemia.  Goal LDL less than 70  -Recheck lipid and hepatic panel  #3 CAD.  Patient took himself off aspirin during the past year.  We recommend that he discuss this with cardiology but have recommended for now him going back on aspirin.  We discussed difference between primary and secondary prevention   No orders of the defined types were placed in this encounter.   Follow-up: No follow-ups on file.    Evelena Peat, MD

## 2021-03-08 NOTE — Patient Instructions (Signed)
Get home BP monitor and be in touch if consistently > 140/90.    Consider getting back on aspirin 81 mg daily unless instructed otherwise by Cardiology.   Try to keep daily sodium < 2,400 mg daily  Keep exercising!

## 2021-03-10 ENCOUNTER — Encounter: Payer: Self-pay | Admitting: Family Medicine

## 2021-03-14 ENCOUNTER — Other Ambulatory Visit: Payer: Self-pay | Admitting: Family Medicine

## 2021-03-18 DIAGNOSIS — Z23 Encounter for immunization: Secondary | ICD-10-CM | POA: Diagnosis not present

## 2021-04-12 ENCOUNTER — Other Ambulatory Visit: Payer: Self-pay

## 2021-04-12 ENCOUNTER — Ambulatory Visit (INDEPENDENT_AMBULATORY_CARE_PROVIDER_SITE_OTHER): Payer: Medicare Other

## 2021-04-12 VITALS — BP 124/72 | HR 69 | Temp 97.6°F | Resp 20 | Wt 175.9 lb

## 2021-04-12 DIAGNOSIS — Z1211 Encounter for screening for malignant neoplasm of colon: Secondary | ICD-10-CM

## 2021-04-12 DIAGNOSIS — Z0000 Encounter for general adult medical examination without abnormal findings: Secondary | ICD-10-CM | POA: Diagnosis not present

## 2021-04-12 DIAGNOSIS — Z122 Encounter for screening for malignant neoplasm of respiratory organs: Secondary | ICD-10-CM | POA: Diagnosis not present

## 2021-04-12 DIAGNOSIS — Z800 Family history of malignant neoplasm of digestive organs: Secondary | ICD-10-CM | POA: Diagnosis not present

## 2021-04-12 NOTE — Patient Instructions (Signed)
[name] , Thank you for taking time to come for your Medicare Wellness Visit. I appreciate your ongoing commitment to your health goals. Please review the following plan we discussed and let me know if I can assist you in the future.   Screening recommendations/referrals: Colonoscopy: Order place 04/12/21 Recommended yearly ophthalmology/optometry visit for glaucoma screening and checkup Recommended yearly dental visit for hygiene and checkup  Vaccinations: Influenza vaccine: Due 05/23/21 Pneumococcal vaccine: Completed  Tdap vaccine: Done 04/24/20 repeat 04/24/30 Shingles vaccine: Shingrix discussed. Please contact your pharmacy for coverage information.    Covid-19: Completed 2/21 & 01/07/20  Advanced directives: Advance directive discussed with you today. I have provided a copy for you to complete at home and have notarized. Once this is complete please bring a copy in to our office so we can scan it into your chart.  Conditions/risks identified: Stay healthy and fit  Next appointment: Follow up in one year for your annual wellness visit.   Preventive Care 71 Years and Older, Male Preventive care refers to lifestyle choices and visits with your health care provider that can promote health and wellness. What does preventive care include? A yearly physical exam. This is also called an annual well check. Dental exams once or twice a year. Routine eye exams. Ask your health care provider how often you should have your eyes checked. Personal lifestyle choices, including: Daily care of your teeth and gums. Regular physical activity. Eating a healthy diet. Avoiding tobacco and drug use. Limiting alcohol use. Practicing safe sex. Taking low doses of aspirin every day. Taking vitamin and mineral supplements as recommended by your health care provider. What happens during an annual well check? The services and screenings done by your health care provider during your annual well check will  depend on your age, overall health, lifestyle risk factors, and family history of disease. Counseling  Your health care provider may ask you questions about your: Alcohol use. Tobacco use. Drug use. Emotional well-being. Home and relationship well-being. Sexual activity. Eating habits. History of falls. Memory and ability to understand (cognition). Work and work Astronomer. Screening  You may have the following tests or measurements: Height, weight, and BMI. Blood pressure. Lipid and cholesterol levels. These may be checked every 5 years, or more frequently if you are over 66 years old. Skin check. Lung cancer screening. You may have this screening every year starting at age 90 if you have a 30-pack-year history of smoking and currently smoke or have quit within the past 15 years. Fecal occult blood test (FOBT) of the stool. You may have this test every year starting at age 58. Flexible sigmoidoscopy or colonoscopy. You may have a sigmoidoscopy every 5 years or a colonoscopy every 10 years starting at age 9. Prostate cancer screening. Recommendations will vary depending on your family history and other risks. Hepatitis C blood test. Hepatitis B blood test. Sexually transmitted disease (STD) testing. Diabetes screening. This is done by checking your blood sugar (glucose) after you have not eaten for a while (fasting). You may have this done every 1-3 years. Abdominal aortic aneurysm (AAA) screening. You may need this if you are a current or former smoker. Osteoporosis. You may be screened starting at age 36 if you are at high risk. Talk with your health care provider about your test results, treatment options, and if necessary, the need for more tests. Vaccines  Your health care provider may recommend certain vaccines, such as: Influenza vaccine. This is recommended every year.  Tetanus, diphtheria, and acellular pertussis (Tdap, Td) vaccine. You may need a Td booster every 10  years. Zoster vaccine. You may need this after age 58. Pneumococcal 13-valent conjugate (PCV13) vaccine. One dose is recommended after age 23. Pneumococcal polysaccharide (PPSV23) vaccine. One dose is recommended after age 25. Talk to your health care provider about which screenings and vaccines you need and how often you need them. This information is not intended to replace advice given to you by your health care provider. Make sure you discuss any questions you have with your health care provider. Document Released: 11/05/2015 Document Revised: 06/28/2016 Document Reviewed: 08/10/2015 Elsevier Interactive Patient Education  2017 [place] Prevention in the Home Falls can cause injuries. They can happen to people of all ages. There are many things you can do to make your home safe and to help prevent falls. What can I do on the outside of my home? Regularly fix the edges of walkways and driveways and fix any cracks. Remove anything that might make you trip as you walk through a door, such as a raised step or threshold. Trim any bushes or trees on the path to your home. Use bright outdoor lighting. Clear any walking paths of anything that might make someone trip, such as rocks or tools. Regularly check to see if handrails are loose or broken. Make sure that both sides of any steps have handrails. Any raised decks and porches should have guardrails on the edges. Have any leaves, snow, or ice cleared regularly. Use sand or salt on walking paths during winter. Clean up any spills in your garage right away. This includes oil or grease spills. What can I do in the bathroom? Use night lights. Install grab bars by the toilet and in the tub and shower. Do not use towel bars as grab bars. Use non-skid mats or decals in the tub or shower. If you need to sit down in the shower, use a plastic, non-slip stool. Keep the floor dry. Clean up any water that spills on the floor as soon as it  happens. Remove soap buildup in the tub or shower regularly. Attach bath mats securely with double-sided non-slip rug tape. Do not have throw rugs and other things on the floor that can make you trip. What can I do in the bedroom? Use night lights. Make sure that you have a light by your bed that is easy to reach. Do not use any sheets or blankets that are too big for your bed. They should not hang down onto the floor. Have a firm chair that has side arms. You can use this for support while you get dressed. Do not have throw rugs and other things on the floor that can make you trip. What can I do in the kitchen? Clean up any spills right away. Avoid walking on wet floors. Keep items that you use a lot in easy-to-reach places. If you need to reach something above you, use a strong step stool that has a grab bar. Keep electrical cords out of the way. Do not use floor polish or wax that makes floors slippery. If you must use wax, use non-skid floor wax. Do not have throw rugs and other things on the floor that can make you trip. What can I do with my stairs? Do not leave any items on the stairs. Make sure that there are handrails on both sides of the stairs and use them. Fix handrails that are broken or loose.  Make sure that handrails are as long as the stairways. Check any carpeting to make sure that it is firmly attached to the stairs. Fix any carpet that is loose or worn. Avoid having throw rugs at the top or bottom of the stairs. If you do have throw rugs, attach them to the floor with carpet tape. Make sure that you have a light switch at the top of the stairs and the bottom of the stairs. If you do not have them, ask someone to add them for you. What else can I do to help prevent falls? Wear shoes that: Do not have high heels. Have rubber bottoms. Are comfortable and fit you well. Are closed at the toe. Do not wear sandals. If you use a stepladder: Make sure that it is fully opened.  Do not climb a closed stepladder. Make sure that both sides of the stepladder are locked into place. Ask someone to hold it for you, if possible. Clearly mark and make sure that you can see: Any grab bars or handrails. First and last steps. Where the edge of each step is. Use tools that help you move around (mobility aids) if they are needed. These include: Canes. Walkers. Scooters. Crutches. Turn on the lights when you go into a dark area. Replace any light bulbs as soon as they burn out. Set up your furniture so you have a clear path. Avoid moving your furniture around. If any of your floors are uneven, fix them. If there are any pets around you, be aware of where they are. Review your medicines with your doctor. Some medicines can make you feel dizzy. This can increase your chance of falling. Ask your doctor what other things that you can do to help prevent falls. This information is not intended to replace advice given to you by your health care provider. Make sure you discuss any questions you have with your health care provider. Document Released: 08/05/2009 Document Revised: 03/16/2016 Document Reviewed: 11/13/2014 Elsevier Interactive Patient Education  2017 [employer].

## 2021-04-12 NOTE — Progress Notes (Addendum)
Subjective:   [name] is a 70 y.o. male who presents for Medicare Annual/Subsequent preventive examination.  Review of Systems     Cardiac Risk Factors include: advanced age (>50men, >42 women);male gender;dyslipidemia;hypertension     Objective:    Today's Vitals   04/12/21 0931 04/12/21 0937  BP: 124/72   Pulse: 69   Resp: 20   Temp: 97.6 F (36.4 C)   SpO2: 97%   Weight: 175 lb 14.4 oz (79.8 kg)   PainSc:  3    Body mass index is 25.24 kg/m.  Advanced Directives 04/12/2021 08/26/2020 12/06/2018  Does Patient Have a Medical Advance Directive? No No No  Would patient like information on creating a medical advance directive? Yes (MAU/Ambulatory/Procedural Areas - Information given) No - Patient declined Yes (MAU/Ambulatory/Procedural Areas - Information given)    Current Medications (verified) Outpatient Encounter Medications as of 04/12/2021  Medication Sig   ALPHA LIPOIC ACID PO Take 100 mg by mouth daily.   aspirin EC 81 MG tablet Take 81 mg by mouth daily. Swallow whole. Pt stopped pt preference   atorvastatin (LIPITOR) 40 MG tablet TAKE 1 TABLET BY MOUTH EVERY DAY   Coenzyme Q10 (COQ10 PO) Take by mouth daily.   desoximetasone (TOPICORT) 0.25 % cream APPLY TO AFFECTED AREA AS NEEDED 1-2 TIMES PER DAY   diclofenac (VOLTAREN) 75 MG EC tablet TAKE 1 TABLET (75 MG TOTAL) BY MOUTH 2 (TWO) TIMES DAILY AS NEEDED FOR MILD PAIN.   famotidine (PEPCID) 20 MG tablet Take 20 mg by mouth 2 (two) times daily.   fish oil-omega-3 fatty acids 1000 MG capsule Take 1 g by mouth daily.   Fluticasone Propionate (FLONASE NA) Place into the nose 2 (two) times daily as needed.   magnesium citrate SOLN Take 1 Bottle by mouth daily.   OVER THE COUNTER MEDICATION asthan 10 mg daily   OVER THE COUNTER MEDICATION dmar  150 mg daily   OVER THE COUNTER MEDICATION Mushroom extract 2 caps daily   OVER THE COUNTER MEDICATION Green tea 1 daily   OVER THE COUNTER MEDICATION bioperine (black  pepper) 1 cap 10 mg daily   OVER THE COUNTER MEDICATION    OVER THE COUNTER MEDICATION hrmp oil     1000 mg ( 1 dropper daily)   Probiotic Product (PROBIOTIC-10 PO) Take by mouth daily.    saw palmetto 160 MG capsule Take 160 mg by mouth 2 (two) times daily.   [DISCONTINUED] methocarbamol (ROBAXIN) 500 MG tablet Take 1 tablet (500 mg total) by mouth every 6 (six) hours as needed (use for muscle cramps/pain). (Patient not taking: Reported on 04/12/2021)   [DISCONTINUED] traMADol (ULTRAM) 50 MG tablet Take 1-2 tablets (50-100 mg total) by mouth every 6 (six) hours as needed for moderate pain or severe pain.   No facility-administered encounter medications on file as of 04/12/2021.    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 Coronary artery disease with 80% proximal and ostial stenosis of      the left anterior descending artery, no significant obstruction of      circumflex and right coronary arteries, and normal left ventricular      function.  2. Successful stenting of the lesion in the ostium of the left      anterior descending artery by using a Promus drug-eluting stent      with IVUS guidance, w   Past Surgical History:  Procedure Laterality Date   ANAL FISTULECTOMY  2004   CORONARY STENT PLACEMENT  2008   VENTRAL HERNIA REPAIR Bilateral 08/26/2020   Procedure: LAPAROSCOPIC VENTRAL  WALL HERNIA REPAIR,   WITH MESH; TAP BLOCK BILATERAL;  Surgeon: Karie Soda, MD;  Location: [facility] [place];  Service: General;  Laterality: Bilateral;   WRIST FUSION  12/2009   left   Family History  Problem Relation Age of Onset   COPD Mother    Heart disease Father 25       CHF   Cancer Sister 10       ?lung cancer   Colon polyps Sister    Cancer Brother 8       colon cancer   Colon cancer Brother    Social History   Socioeconomic History   Marital status: Single    Spouse name: Not on file   Number of children: 0   Years of education: Not on file   Highest education level: Not on file  Occupational History   Occupation: Airline pilot    Comment: full time  Tobacco Use   Smoking status: Former    Packs/day: 0.50    Years: 35.00    Pack years: 17.50    Types: Cigarettes    Quit date: 03/23/2008    Years since quitting: 13.0   Smokeless tobacco: Never  Vaping Use   Vaping Use: Never used  Substance and Sexual Activity   Alcohol use: Yes    Comment: 1-2 drinks a year   Drug use: No   Sexual activity: Not on file  Other Topics Concern   Not on file  Social History Narrative   Lives alone with 2 dogs, one level living   [place], drives much for work   Enjoys doing Presenter, broadcasting, environmentally-conscious   Social Determinants of Corporate investment banker Strain: Low Risk    Difficulty of Paying Living Expenses: Not hard at all  Food Insecurity: No Food Insecurity   Worried About Programme researcher, broadcasting/film/video in the Last Year: Never true   Barista in the Last Year: Never true  Transportation Needs: No Transportation Needs   Lack of Transportation (Medical): No   Lack of Transportation (Non-Medical): No  Physical Activity: Inactive   Days of Exercise per Week: 0 days   Minutes of Exercise per Session: 0 min  Stress: No Stress Concern Present   Feeling of Stress : Not at all  Social Connections: Socially Isolated   Frequency of Communication with Friends and  Family: Twice a week   Frequency of Social Gatherings with Friends and Family: Once a week   Attends Religious Services: Never   Database administrator or Organizations: No   Attends Banker Meetings: Never   Marital Status: Never married    Tobacco Counseling Counseling given: Not Answered   Clinical Intake:  Pre-visit  preparation completed: Yes  Pain : 0-10 Pain Score: 3  Pain Type: Chronic pain Pain Location: Knee Pain Orientation: Right Pain Descriptors / Indicators: Other (Comment) (fills weak) Pain Onset: More than a month ago Pain Frequency: Intermittent     BMI - recorded: 25.24 Nutritional Status: BMI 25 -29 Overweight Nutritional Risks: None Diabetes: No  How often do you need to have someone help you when you read instructions, pamphlets, or other written materials from your doctor or pharmacy?: 1 - Never  Diabetic?No  Interpreter Needed?: No  Information entered by :: Lanier Ensign, LPN   Activities of Daily Living In your present state of health, do you have any difficulty performing the following activities: 04/12/2021 08/26/2020  Hearing? N N  Vision? N N  Difficulty concentrating or making decisions? N N  Walking or climbing stairs? N N  Dressing or bathing? N -  Doing errands, shopping? N -  Preparing Food and eating ? N -  Using the Toilet? N -  In the past six months, have you accidently leaked urine? N -  Do you have problems with loss of bowel control? N -  Managing your Medications? N -  Managing your Finances? N -  Housekeeping or managing your Housekeeping? N -  Some recent data might be hidden    Patient Care Team: Kristian Covey, MD as PCP - General Pricilla Riffle, MD as PCP - Cardiology (Cardiology) Dominica Severin, MD as Consulting Physician (Orthopedic Surgery) Sinda Du, MD as Consulting Physician (Ophthalmology) Karie Soda, MD as Consulting Physician (General Surgery)  Indicate any recent Medical  Services you may have received from other than [facility] providers in the past year (date may be approximate).     Assessment:   This is a routine wellness examination for Braxton.  Hearing/Vision screen Hearing Screening - Comments:: Pt denies any hearing issues  Vision Screening - Comments:: Pt follows up withDr Bowen for annual eye exams   Dietary issues and exercise activities discussed: Current Exercise Habits: The patient does not participate in regular exercise at present   Goals Addressed             This Visit's Progress    Patient Stated       Stay fit and healthy        Depression Screen PHQ 2/9 Scores 04/12/2021 03/26/2020 12/06/2018 10/05/2017 06/18/2015  PHQ - 2 Score 0 0 0 0 0  PHQ- 9 Score - - 0 - -    Fall Risk Fall Risk  04/12/2021 03/26/2020 12/06/2018 12/06/2018 10/05/2017  Falls in the past year? - 0 0 0 Yes  Number falls in past yr: 1 0 - 0 1  Injury with Fall? 1 0 - 0 Yes  Comment right knee - - - -  Risk for fall due to : Impaired vision - - - -  Follow up Falls prevention discussed Falls evaluation completed - - -    FALL RISK PREVENTION PERTAINING TO THE HOME:  Any stairs in or around the home? Yes  If so, are there any without handrails? No  Home free of loose throw rugs in walkways, pet beds, electrical cords, etc? Yes  Adequate lighting in your home to reduce risk of falls? Yes   ASSISTIVE DEVICES UTILIZED TO PREVENT FALLS:  Life alert? No  Use of a cane, walker or w/c? No  Grab bars in the bathroom? No  Shower chair or bench in shower? No  Elevated toilet seat or a  handicapped toilet? No   TIMED UP AND GO:  Was the test performed? Yes .  Length of time to ambulate 10 feet: 10 sec.   Gait steady and fast without use of assistive device  Cognitive Function:     6CIT Screen 04/12/2021  What Year? 0 points  What month? 0 points  What time? 0 points  Count back from 20 0 points  Months in reverse 0 points  Repeat phrase 0 points   Total Score 0    Immunizations Immunization History  Administered Date(s) Administered   Fluad Quad(high Dose 65+) 09/11/2019   Influenza, High Dose Seasonal PF 09/20/2015, 09/06/2017, 09/10/2018   Influenza,inj,quad, With Preservative 08/23/2017   PFIZER(Purple Top)SARS-COV-2 Vaccination 12/14/2019, 01/07/2020   Pneumococcal Conjugate-13 06/18/2015   Pneumococcal Polysaccharide-23 11/21/2010, 10/05/2017   Td 10/24/2007   Tdap 04/24/2020   Zoster, Live 12/11/2011    TDAP status: Up to date  Flu Vaccine status: Up to date  Pneumococcal vaccine status: Up to date  Covid-19 vaccine status: Completed vaccines  Qualifies for Shingles Vaccine? Yes   Zostavax completed No   Shingrix Completed?: No.    Education has been provided regarding the importance of this vaccine. Patient has been advised to call insurance company to determine out of pocket expense if they have not yet received this vaccine. Advised may also receive vaccine at local pharmacy or Health Dept. Verbalized acceptance and understanding.  Screening Tests Health Maintenance  Topic Date Due   Zoster Vaccines- Shingrix (1 of 2) Never done   COLONOSCOPY (Pts [phone] Insurance coverage will need to be confirmed)  02/08/2017   COVID-19 Vaccine (3 - Booster for Pfizer series) 06/08/2020   INFLUENZA VACCINE  05/23/2021   TETANUS/TDAP  04/24/2030   Hepatitis C Screening  Completed   PNA vac Low Risk Adult  Completed   HPV VACCINES  Aged Out    Health Maintenance  Health Maintenance Due  Topic Date Due   Zoster Vaccines- Shingrix (1 of 2) Never done   COLONOSCOPY (Pts 45-7141yrs Insurance coverage will need to be confirmed)  02/08/2017   COVID-19 Vaccine (3 - Booster for Pfizer series) 06/08/2020    Colorectal cancer screening: Referral to GI placed 04/12/21. Pt aware the office will call re: appt.    Lung Cancer Screening: (Low Dose CT Chest recommended if Age 73-80 years, 30 pack-year currently smoking OR have  quit w/in 15years.) does qualify.  Lung Cancer screening referral: needs to be placed    Additional Screening:  Hepatitis C Screening:  Completed 10/05/17  Vision Screening: Recommended annual ophthalmology exams for early detection of glaucoma and other disorders of the eye. Is the patient up to date with their annual eye exam?  Yes  Who is the provider or what is the name of the office in which the patient attends annual eye exams? Dr Cathey EndowBowen  If pt is not established with a provider, would they like to be referred to a provider to establish care? No .   Dental Screening: Recommended annual dental exams for proper oral hygiene  Community Resource Referral / Chronic Care Management: CRR required this visit?  No   CCM required this visit?  No      Plan:     I have personally reviewed and noted the following in the patient's chart:   Medical and social history Use of alcohol, tobacco or illicit drugs  Current medications and supplements including opioid prescriptions. Patient is not currently taking opioid prescriptions. Functional ability and  status Nutritional status Physical activity Advanced directives List of other physicians Hospitalizations, surgeries, and ER visits in previous 12 months Vitals Screenings to include cognitive, depression, and falls Referrals and appointments  In addition, I have reviewed and discussed with patient certain preventive protocols, quality metrics, and best practice recommendations. A written personalized care plan for preventive services as well as general preventive health recommendations were provided to patient.     Marzella Schlein, LPN   [id number]   Nurse Notes: please advise pt would like to have lung Cancer screening I was unable to order. Pt would like to have procedure done at [facility]

## 2021-05-11 DIAGNOSIS — U071 COVID-19: Secondary | ICD-10-CM | POA: Diagnosis not present

## 2021-08-02 DIAGNOSIS — Z23 Encounter for immunization: Secondary | ICD-10-CM | POA: Diagnosis not present

## 2021-08-06 ENCOUNTER — Other Ambulatory Visit: Payer: Self-pay | Admitting: Family Medicine

## 2021-08-08 DIAGNOSIS — M5451 Vertebrogenic low back pain: Secondary | ICD-10-CM | POA: Diagnosis not present

## 2021-08-19 ENCOUNTER — Other Ambulatory Visit: Payer: Self-pay

## 2021-08-19 ENCOUNTER — Ambulatory Visit (INDEPENDENT_AMBULATORY_CARE_PROVIDER_SITE_OTHER): Payer: Medicare Other

## 2021-08-19 DIAGNOSIS — Z23 Encounter for immunization: Secondary | ICD-10-CM | POA: Diagnosis not present

## 2021-08-29 DIAGNOSIS — M9905 Segmental and somatic dysfunction of pelvic region: Secondary | ICD-10-CM | POA: Diagnosis not present

## 2021-08-29 DIAGNOSIS — M9904 Segmental and somatic dysfunction of sacral region: Secondary | ICD-10-CM | POA: Diagnosis not present

## 2021-08-29 DIAGNOSIS — M5136 Other intervertebral disc degeneration, lumbar region: Secondary | ICD-10-CM | POA: Diagnosis not present

## 2021-08-29 DIAGNOSIS — M9903 Segmental and somatic dysfunction of lumbar region: Secondary | ICD-10-CM | POA: Diagnosis not present

## 2021-09-14 DIAGNOSIS — M9905 Segmental and somatic dysfunction of pelvic region: Secondary | ICD-10-CM | POA: Diagnosis not present

## 2021-09-14 DIAGNOSIS — M5136 Other intervertebral disc degeneration, lumbar region: Secondary | ICD-10-CM | POA: Diagnosis not present

## 2021-09-14 DIAGNOSIS — M9904 Segmental and somatic dysfunction of sacral region: Secondary | ICD-10-CM | POA: Diagnosis not present

## 2021-09-14 DIAGNOSIS — M9903 Segmental and somatic dysfunction of lumbar region: Secondary | ICD-10-CM | POA: Diagnosis not present

## 2021-09-29 DIAGNOSIS — M5136 Other intervertebral disc degeneration, lumbar region: Secondary | ICD-10-CM | POA: Diagnosis not present

## 2021-09-29 DIAGNOSIS — M9905 Segmental and somatic dysfunction of pelvic region: Secondary | ICD-10-CM | POA: Diagnosis not present

## 2021-09-29 DIAGNOSIS — M9903 Segmental and somatic dysfunction of lumbar region: Secondary | ICD-10-CM | POA: Diagnosis not present

## 2021-09-29 DIAGNOSIS — M9904 Segmental and somatic dysfunction of sacral region: Secondary | ICD-10-CM | POA: Diagnosis not present

## 2021-10-11 DIAGNOSIS — M5136 Other intervertebral disc degeneration, lumbar region: Secondary | ICD-10-CM | POA: Diagnosis not present

## 2021-10-11 DIAGNOSIS — M9904 Segmental and somatic dysfunction of sacral region: Secondary | ICD-10-CM | POA: Diagnosis not present

## 2021-10-11 DIAGNOSIS — M9905 Segmental and somatic dysfunction of pelvic region: Secondary | ICD-10-CM | POA: Diagnosis not present

## 2021-10-11 DIAGNOSIS — M9903 Segmental and somatic dysfunction of lumbar region: Secondary | ICD-10-CM | POA: Diagnosis not present

## 2021-10-18 DIAGNOSIS — M9904 Segmental and somatic dysfunction of sacral region: Secondary | ICD-10-CM | POA: Diagnosis not present

## 2021-10-18 DIAGNOSIS — M9903 Segmental and somatic dysfunction of lumbar region: Secondary | ICD-10-CM | POA: Diagnosis not present

## 2021-10-18 DIAGNOSIS — M5136 Other intervertebral disc degeneration, lumbar region: Secondary | ICD-10-CM | POA: Diagnosis not present

## 2021-10-18 DIAGNOSIS — M9905 Segmental and somatic dysfunction of pelvic region: Secondary | ICD-10-CM | POA: Diagnosis not present

## 2021-11-24 DIAGNOSIS — H40023 Open angle with borderline findings, high risk, bilateral: Secondary | ICD-10-CM | POA: Diagnosis not present

## 2021-12-02 ENCOUNTER — Ambulatory Visit (INDEPENDENT_AMBULATORY_CARE_PROVIDER_SITE_OTHER): Payer: Medicare Other | Admitting: Family Medicine

## 2021-12-02 VITALS — BP 130/78 | HR 72 | Temp 97.5°F | Wt 180.2 lb

## 2021-12-02 DIAGNOSIS — H6122 Impacted cerumen, left ear: Secondary | ICD-10-CM

## 2021-12-02 DIAGNOSIS — R030 Elevated blood-pressure reading, without diagnosis of hypertension: Secondary | ICD-10-CM

## 2021-12-02 IMAGING — DX DG HAND COMPLETE 3+V*L*
3 series · 3 of 3 positions shown · non-contrast
Comparison: Left hand radiograph dated 08/24/2007.

CLINICAL DATA: 68-year-old male with pain in the left second MCP
joint.

EXAM:
LEFT HAND - COMPLETE 3+ VIEW

[hand pa]
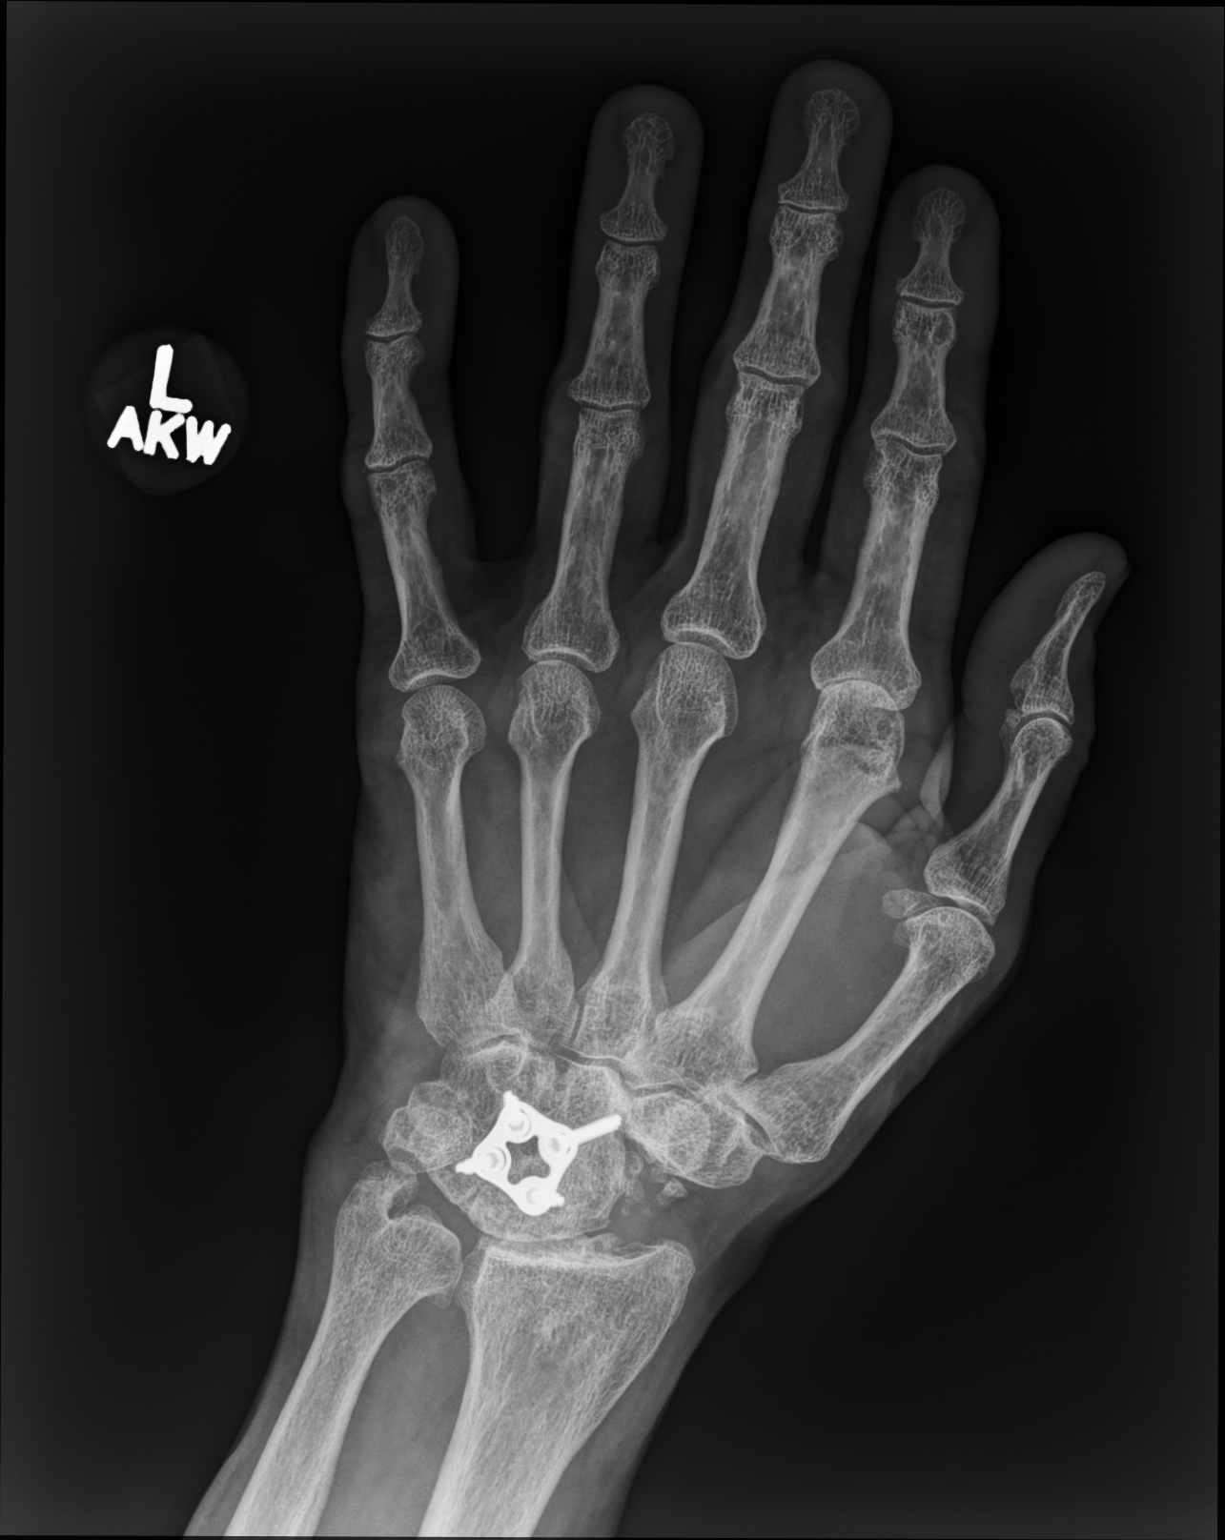

[hand mlo]
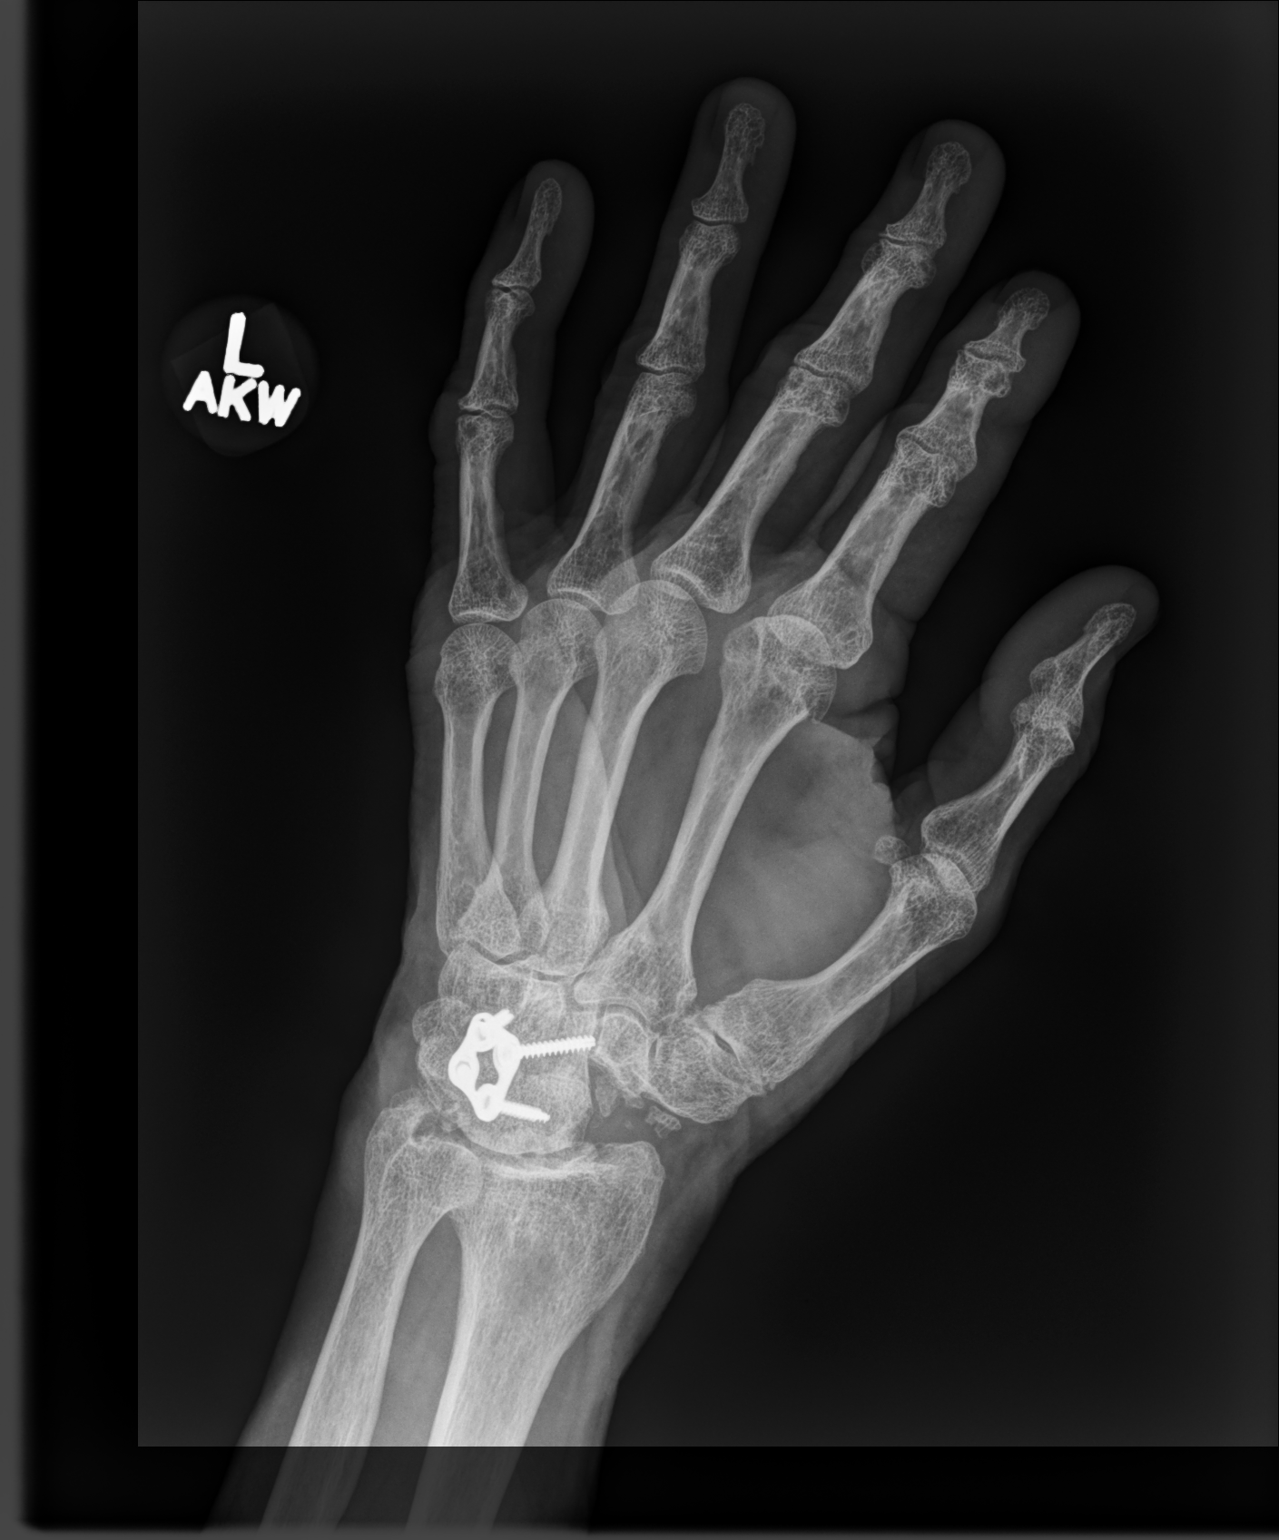

[hand lat]
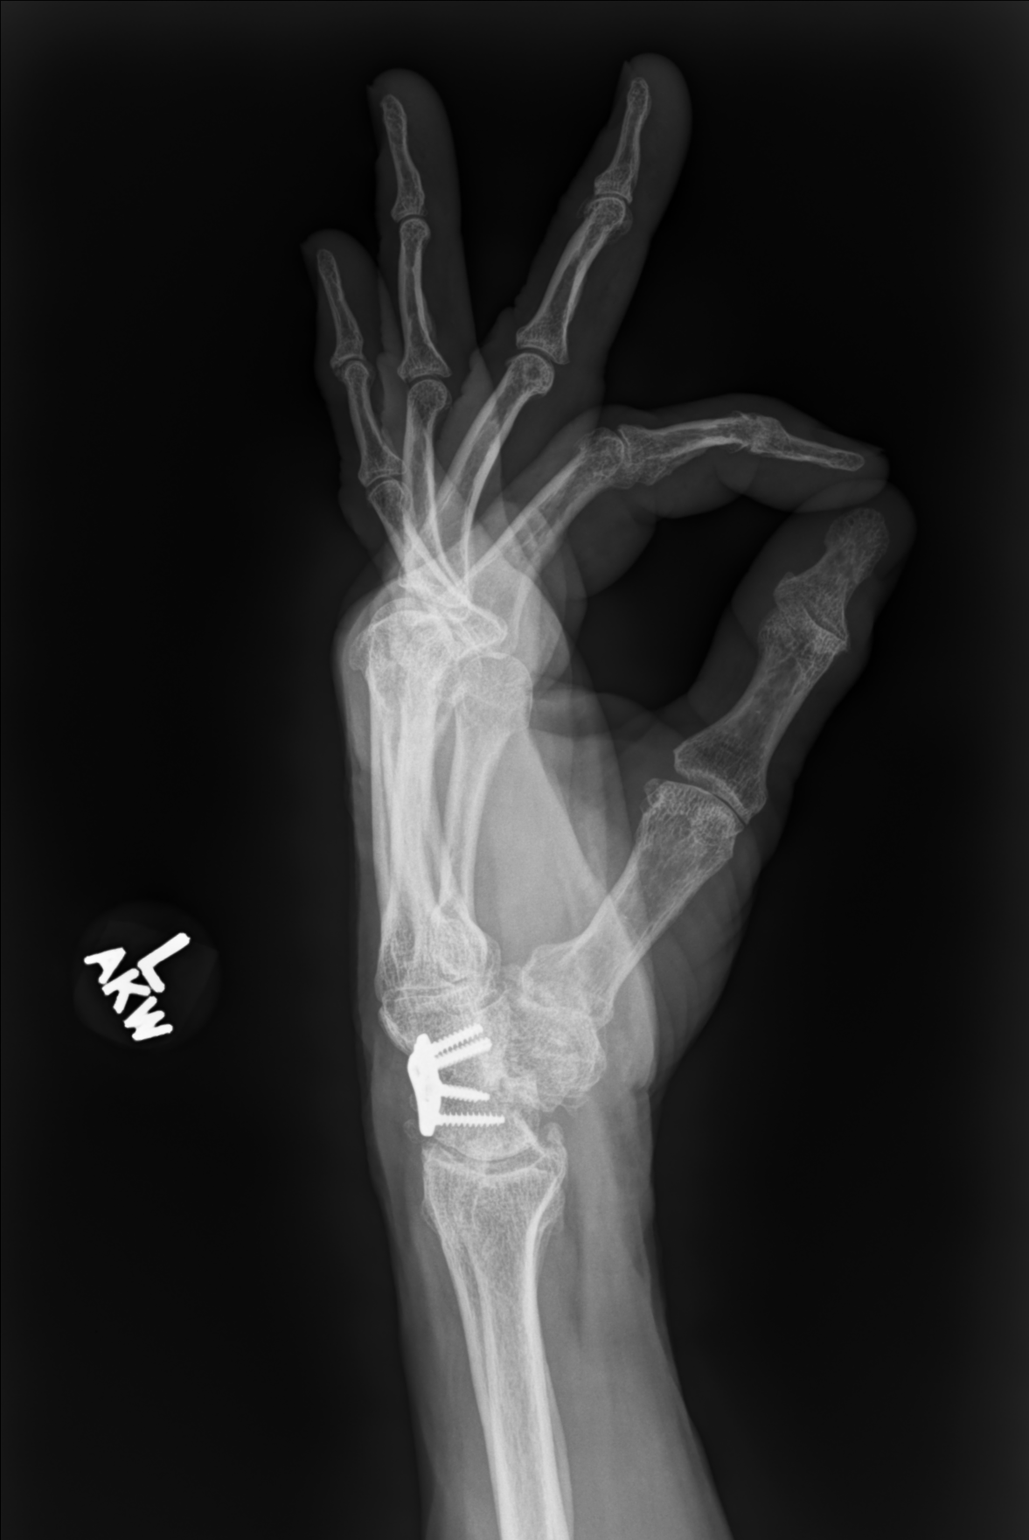

[3 of 3 positions shown; findings below may reference images not displayed]

FINDINGS: There is no acute fracture or dislocation. The bones are osteopenic.
Fusion plate and screws of the carpal bones. Small chronic bone
fragmentation over the radial aspect of the wrist. The soft tissues
are grossly unremarkable.
IMPRESSION: 1. No acute fracture or dislocation.
2. Osteopenia.

## 2021-12-02 NOTE — Progress Notes (Signed)
Established Patient Office Visit  Subjective:  Patient ID: [name], male    DOB: December 06, 1950  Age: 71 y.o. MRN: [id number]  CC:  Chief Complaint  Patient presents with   Ear Fullness    HPI [name] presents for left ear fullness and decreased hearing left ear.  Similar symptoms with wax but up in the past.  No ear pain.  No ear drainage.  Denies any nasal congestion.  No fevers or chills.  No vertigo.  Initial elevated blood pressure but he states he has been monitoring in the past at home and consistent readings less than 130 systolic and less than 80 diastolic.  No recent headaches or chest pains.  He does have a history of CAD.  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 Coronary artery disease with 80% proximal and ostial stenosis of      the left anterior descending artery, no significant obstruction of      circumflex and right coronary arteries, and normal left ventricular      function.  2. Successful stenting of the lesion in the ostium of the left      anterior descending artery by using a Promus drug-eluting stent      with IVUS guidance, w    Past Surgical History:  Procedure Laterality Date   ANAL FISTULECTOMY  2004   CORONARY STENT PLACEMENT  2008   VENTRAL HERNIA REPAIR Bilateral 08/26/2020   Procedure: LAPAROSCOPIC VENTRAL WALL HERNIA REPAIR,   WITH MESH; TAP BLOCK BILATERAL;  Surgeon:  Karie Soda, MD;  Location: [facility] [place];  Service: General;  Laterality: Bilateral;   WRIST FUSION  12/2009   left    Family History  Problem Relation Age of Onset   COPD Mother    Heart disease Father 104       CHF   Cancer Sister 21       ?lung cancer   Colon polyps Sister    Cancer Brother 30       colon cancer   Colon cancer Brother     Social History   Socioeconomic History   Marital status: Single    Spouse name: Not on file   Number of children: 0   Years of education: Not on file   Highest education level: Not on file  Occupational History   Occupation: Airline pilot    Comment: full time  Tobacco Use   Smoking status: Former    Packs/day: 0.50    Years: 35.00    Pack years: 17.50    Types: Cigarettes    Quit date: 03/23/2008    Years since quitting: 13.7   Smokeless tobacco: Never  Vaping Use   Vaping Use: Never used  Substance and Sexual Activity   Alcohol use: Yes    Comment: 1-2 drinks a year   Drug use:  No   Sexual activity: Not on file  Other Topics Concern   Not on file  Social History Narrative   Lives alone with 2 dogs, one level living   [place], drives much for work   Enjoys doing Presenter, broadcasting, environmentally-conscious   Social Determinants of Corporate investment banker Strain: Low Risk    Difficulty of Paying Living Expenses: Not hard at all  Food Insecurity: No Food Insecurity   Worried About Programme researcher, broadcasting/film/video in the Last Year: Never true   Barista in the Last Year: Never true  Transportation Needs: No Transportation Needs   Lack of Transportation (Medical): No   Lack of Transportation (Non-Medical): No  Physical Activity: Sufficiently Active   Days of Exercise per Week: 3 days   Minutes of Exercise per Session: 60 min  Stress: No Stress Concern Present   Feeling of Stress : Not at all  Social Connections: Unknown   Frequency of Communication with Friends and Family: Patient refused   Frequency of Social Gatherings  with Friends and Family: Patient refused   Attends Religious Services: Patient refused   Database administrator or Organizations: No   Attends Engineer, structural: Never   Marital Status: Never married  Catering manager Violence: Not At Risk   Fear of Current or Ex-Partner: No   Emotionally Abused: No   Physically Abused: No   Sexually Abused: No    Outpatient Medications Prior to Visit  Medication Sig Dispense Refill   ALPHA LIPOIC ACID PO Take 100 mg by mouth daily.     aspirin EC 81 MG tablet Take 81 mg by mouth daily. Swallow whole. Pt stopped pt preference     atorvastatin (LIPITOR) 40 MG tablet TAKE 1 TABLET BY MOUTH EVERY DAY 90 tablet 3   Coenzyme Q10 (COQ10 PO) Take by mouth daily.     desoximetasone (TOPICORT) 0.25 % cream APPLY TO AFFECTED AREA AS NEEDED 1-2 TIMES PER DAY 60 g 1   diclofenac (VOLTAREN) 75 MG EC tablet TAKE 1 TABLET (75 MG TOTAL) BY MOUTH 2 (TWO) TIMES DAILY AS NEEDED FOR MILD PAIN. 180 tablet 0   famotidine (PEPCID) 20 MG tablet Take 20 mg by mouth 2 (two) times daily.     fish oil-omega-3 fatty acids 1000 MG capsule Take 1 g by mouth daily.     Fluticasone Propionate (FLONASE NA) Place into the nose 2 (two) times daily as needed.     magnesium citrate SOLN Take 1 Bottle by mouth daily.     OVER THE COUNTER MEDICATION asthan 10 mg daily     OVER THE COUNTER MEDICATION dmar  150 mg daily     OVER THE COUNTER MEDICATION Mushroom extract 2 caps daily     OVER THE COUNTER MEDICATION Green tea 1 daily     OVER THE COUNTER MEDICATION bioperine (black pepper) 1 cap 10 mg daily     OVER THE COUNTER MEDICATION      OVER THE COUNTER MEDICATION hrmp oil     1000 mg ( 1 dropper daily)     Probiotic Product (PROBIOTIC-10 PO) Take by mouth daily.      saw palmetto 160 MG capsule Take 160 mg by mouth 2 (two) times daily.     No facility-administered medications prior to visit.    Allergies  Allergen Reactions   Codeine     Hives???    ROS Review of  Systems  Constitutional:  Negative for  chills and fever.  HENT:  Positive for hearing loss. Negative for ear discharge and ear pain.      Objective:    Physical Exam Vitals reviewed.  HENT:     Ears:     Comments: There is minimal cerumen right canal.  Left canal impacted with cerumen.  This was fully removed with irrigation.  Eardrum appears normal.  No residual cerumen. Cardiovascular:     Rate and Rhythm: Normal rate and regular rhythm.  Pulmonary:     Effort: Pulmonary effort is normal.     Breath sounds: Normal breath sounds. No wheezing or rales.    BP 130/78 (BP Location: Left Arm, Cuff Size: Normal)    Pulse 72    Temp (!) 97.5 F (36.4 C) (Oral)    Wt 180 lb 3.2 oz (81.7 kg)    SpO2 98%    BMI 25.86 kg/m  Wt Readings from Last 3 Encounters:  12/02/21 180 lb 3.2 oz (81.7 kg)  04/12/21 175 lb 14.4 oz (79.8 kg)  03/08/21 175 lb 6.4 oz (79.6 kg)     Health Maintenance Due  Topic Date Due   Zoster Vaccines- Shingrix (1 of 2) Never done   COLONOSCOPY (Pts 45-35yrs Insurance coverage will need to be confirmed)  02/08/2017   COVID-19 Vaccine (3 - Booster for Pfizer series) 03/03/2020    There are no preventive care reminders to display for this patient.  Lab Results  Component Value Date   TSH 1.23 06/16/2015   Lab Results  Component Value Date   WBC 5.6 06/16/2015   HGB 15.3 08/26/2020   HCT 45.0 08/26/2020   MCV 90.3 06/16/2015   PLT 212.0 06/16/2015   Lab Results  Component Value Date   NA 139 03/08/2021   K 4.9 03/08/2021   CO2 28 03/08/2021   GLUCOSE 95 03/08/2021   BUN 17 03/08/2021   CREATININE 0.85 03/08/2021   BILITOT 0.9 03/08/2021   ALKPHOS 59 03/08/2021   AST 28 03/08/2021   ALT 30 03/08/2021   PROT 7.4 03/08/2021   ALBUMIN 4.9 03/08/2021   CALCIUM 9.8 03/08/2021   GFR 87.89 03/08/2021   Lab Results  Component Value Date   CHOL 152 03/08/2021   Lab Results  Component Value Date   HDL 71.70 03/08/2021   Lab Results  Component  Value Date   LDLCALC 72 03/08/2021   Lab Results  Component Value Date   TRIG 40.0 03/08/2021   Lab Results  Component Value Date   CHOLHDL 2 03/08/2021   No results found for: HGBA1C    Assessment & Plan:    #1 hearing loss related to cerumen impaction left canal.  We discussed risk and benefits of irrigation including risk of bleeding and low risk of pain and perforation.  Patient consented.  This was irrigated without difficulty.  Large cerumen plug removed patient symptomatically improved afterwards  #2 elevated blood pressure additionally.  This came down with repeat after rest.  He is also been monitoring at home and getting consistently good readings there.  Continue regular exercise habits.  He is scheduled for physical in June  No orders of the defined types were placed in this encounter.   Follow-up: No follow-ups on file.    Evelena Peat, MD

## 2021-12-09 DIAGNOSIS — M79671 Pain in right foot: Secondary | ICD-10-CM | POA: Diagnosis not present

## 2021-12-09 DIAGNOSIS — M722 Plantar fascial fibromatosis: Secondary | ICD-10-CM | POA: Diagnosis not present

## 2021-12-21 ENCOUNTER — Other Ambulatory Visit: Payer: Self-pay | Admitting: Family Medicine

## 2022-01-13 DIAGNOSIS — H40023 Open angle with borderline findings, high risk, bilateral: Secondary | ICD-10-CM | POA: Diagnosis not present

## 2022-01-17 DIAGNOSIS — M9905 Segmental and somatic dysfunction of pelvic region: Secondary | ICD-10-CM | POA: Diagnosis not present

## 2022-01-17 DIAGNOSIS — M9904 Segmental and somatic dysfunction of sacral region: Secondary | ICD-10-CM | POA: Diagnosis not present

## 2022-01-17 DIAGNOSIS — M5136 Other intervertebral disc degeneration, lumbar region: Secondary | ICD-10-CM | POA: Diagnosis not present

## 2022-01-17 DIAGNOSIS — M9903 Segmental and somatic dysfunction of lumbar region: Secondary | ICD-10-CM | POA: Diagnosis not present

## 2022-02-08 ENCOUNTER — Telehealth: Payer: Self-pay | Admitting: Family Medicine

## 2022-02-08 MED ORDER — DESOXIMETASONE 0.25 % EX CREA: TOPICAL_CREAM | CUTANEOUS | 1 refills | Status: AC

## 2022-02-08 MED ORDER — DICLOFENAC SODIUM 75 MG PO TBEC: DELAYED_RELEASE_TABLET | ORAL | 0 refills | Status: DC

## 2022-02-08 NOTE — Telephone Encounter (Signed)
Pt has new pharm and needs rx for desoximetasone (TOPICORT) 0.25 % cream and diclofenac (VOLTAREN) 75 MG EC tablet 90 day supply  ?[facility] [address] [place], [place] Phone:  [phone]  ?Fax:  [phone]  ?  ? ?

## 2022-02-08 NOTE — Telephone Encounter (Signed)
Rx sent 

## 2022-03-22 ENCOUNTER — Other Ambulatory Visit: Payer: Self-pay | Admitting: Family Medicine

## 2022-04-18 ENCOUNTER — Ambulatory Visit (INDEPENDENT_AMBULATORY_CARE_PROVIDER_SITE_OTHER): Payer: Medicare Other

## 2022-04-18 VITALS — BP 124/74 | HR 64 | Temp 97.7°F | Ht 70.5 in | Wt 175.9 lb

## 2022-04-18 DIAGNOSIS — Z0000 Encounter for general adult medical examination without abnormal findings: Secondary | ICD-10-CM | POA: Diagnosis not present

## 2022-04-20 DIAGNOSIS — M9904 Segmental and somatic dysfunction of sacral region: Secondary | ICD-10-CM | POA: Diagnosis not present

## 2022-04-20 DIAGNOSIS — M9905 Segmental and somatic dysfunction of pelvic region: Secondary | ICD-10-CM | POA: Diagnosis not present

## 2022-04-20 DIAGNOSIS — M5136 Other intervertebral disc degeneration, lumbar region: Secondary | ICD-10-CM | POA: Diagnosis not present

## 2022-04-20 DIAGNOSIS — M9903 Segmental and somatic dysfunction of lumbar region: Secondary | ICD-10-CM | POA: Diagnosis not present

## 2022-05-24 DIAGNOSIS — M9905 Segmental and somatic dysfunction of pelvic region: Secondary | ICD-10-CM | POA: Diagnosis not present

## 2022-05-24 DIAGNOSIS — M5136 Other intervertebral disc degeneration, lumbar region: Secondary | ICD-10-CM | POA: Diagnosis not present

## 2022-05-24 DIAGNOSIS — M9904 Segmental and somatic dysfunction of sacral region: Secondary | ICD-10-CM | POA: Diagnosis not present

## 2022-05-24 DIAGNOSIS — M9903 Segmental and somatic dysfunction of lumbar region: Secondary | ICD-10-CM | POA: Diagnosis not present

## 2022-06-14 ENCOUNTER — Other Ambulatory Visit: Payer: Self-pay | Admitting: Family Medicine

## 2022-07-07 ENCOUNTER — Ambulatory Visit (INDEPENDENT_AMBULATORY_CARE_PROVIDER_SITE_OTHER): Payer: Medicare Other | Admitting: Family Medicine

## 2022-07-07 VITALS — BP 130/80 | HR 68 | Temp 97.4°F | Ht 68.5 in | Wt 169.4 lb

## 2022-07-07 DIAGNOSIS — E785 Hyperlipidemia, unspecified: Secondary | ICD-10-CM | POA: Diagnosis not present

## 2022-07-07 DIAGNOSIS — Z955 Presence of coronary angioplasty implant and graft: Secondary | ICD-10-CM | POA: Diagnosis not present

## 2022-07-07 DIAGNOSIS — Z125 Encounter for screening for malignant neoplasm of prostate: Secondary | ICD-10-CM | POA: Diagnosis not present

## 2022-07-07 DIAGNOSIS — M159 Polyosteoarthritis, unspecified: Secondary | ICD-10-CM

## 2022-07-07 LAB — COMPREHENSIVE METABOLIC PANEL
ALT: 19 U/L (ref 0–53)
AST: 24 U/L (ref 0–37)
Albumin: 4.4 g/dL (ref 3.5–5.2)
Alkaline Phosphatase: 56 U/L (ref 39–117)
BUN: 16 mg/dL (ref 6–23)
CO2: 26 mEq/L (ref 19–32)
Calcium: 9.6 mg/dL (ref 8.4–10.5)
Chloride: 101 mEq/L (ref 96–112)
Creatinine, Ser: 0.93 mg/dL (ref 0.40–1.50)
GFR: 82.28 mL/min (ref >60.00)
Glucose, Bld: 84 mg/dL (ref 70–99)
Potassium: 4.3 mEq/L (ref 3.5–5.1)
Sodium: 136 mEq/L (ref 135–145)
Total Bilirubin: 1.1 mg/dL (ref 0.2–1.2)
Total Protein: 7.1 g/dL (ref 6.0–8.3)

## 2022-07-07 LAB — LIPID PANEL
Cholesterol: 152 mg/dL (ref 0–200)
HDL: 75.5 mg/dL (ref >39.00)
LDL Cholesterol: 67 mg/dL (ref 0–99)
NonHDL: 76.84
Total CHOL/HDL Ratio: 2
Triglycerides: 47 mg/dL (ref 0.0–149.0)
VLDL: 9.4 mg/dL (ref 0.0–40.0)

## 2022-07-07 LAB — PSA, MEDICARE: PSA: 1.38 ng/ml (ref 0.10–4.00)

## 2022-07-07 NOTE — Patient Instructions (Signed)
Remember Flu vaccine later this Fall  Consider Shingrix vaccine.

## 2022-07-07 NOTE — Progress Notes (Signed)
Established Patient Office Visit  Subjective   Patient ID: [name], male    DOB: 1951/10/21  Age: 71 y.o. MRN: [id number]  Chief Complaint  Patient presents with   Annual Exam    HPI   Patient seen today for medical follow-up.  He was initially scheduled for "complete physical ".  However, he has Medicare primary and already had Medicare wellness visit. He has history of CAD, hyperlipidemia, osteoarthritis.  Remains on aspirin 81 mg daily and atorvastatin 40 mg daily.  Also takes coenzyme every 10.  Occasionally takes diclofenac for arthritis symptoms.  No recent chest pains.  Has had some nocturia in the past with probable BPH and currently taking over-the-counter product with saw palmetto which she thinks is helping.  No slow stream.  He plans get flu vaccine later this fall around early November.  Pneumonia vaccines complete.  Has not had Shingrix but did have Zostavax back in 2013.  Still working full-time.  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 Coronary artery disease with 80% proximal and ostial stenosis of      the left anterior descending artery, no significant obstruction of      circumflex and right coronary arteries, and normal left ventricular      function.  2. Successful stenting of the lesion in the  ostium of the left      anterior descending artery by using a Promus drug-eluting stent      with IVUS guidance, w   Past Surgical History:  Procedure Laterality Date   ANAL FISTULECTOMY  2004   CORONARY STENT PLACEMENT  2008   VENTRAL HERNIA REPAIR Bilateral 08/26/2020   Procedure: LAPAROSCOPIC VENTRAL WALL HERNIA REPAIR,   WITH MESH; TAP BLOCK BILATERAL;  Surgeon: Karie Soda, MD;  Location: [facility];  Service: General;  Laterality: Bilateral;   WRIST FUSION  12/2009   left    reports that he quit smoking about 14 years ago. His smoking use included cigarettes. He has a 17.50 pack-year smoking history. He has never used smokeless tobacco. He reports that he does not currently use alcohol. He reports that he does not use drugs. family history includes COPD in his mother; Cancer (age of onset: 34) in his sister; Cancer (age of onset: 50) in his brother; Colon cancer in his brother; Colon polyps in his sister; Heart disease (age of onset: 38) in his father. Allergies  Allergen Reactions   Codeine     Hives???    Review of Systems  Constitutional:  Negative for chills, fever and weight loss.  Respiratory:  Negative for cough and sputum production.   Cardiovascular:  Negative for chest pain.  Gastrointestinal:  Negative for abdominal pain.  Genitourinary:  Negative for dysuria.  Neurological:  Negative for loss of consciousness and headaches.      Objective:     BP 130/80 (BP Location: Left Arm, Patient Position: Sitting, Cuff Size: Normal)   Pulse 68   Temp (!) 97.4 F (36.3 C) (Oral)   Ht 5' 8.5" (1.74 m)   Wt 169 lb 6.4 oz (76.8 kg)   SpO2 99%   BMI 25.38 kg/m  BP Readings from Last 3 Encounters:  07/07/22 130/80  04/18/22 124/74  12/02/21 130/78   Wt Readings from Last 3 Encounters:  07/07/22 169 lb 6.4 oz (76.8 kg)  04/18/22 175 lb 14.4 oz (79.8 kg)  12/02/21 180 lb 3.2 oz (81.7 kg)      Physical Exam Vitals reviewed.  Constitutional:       Appearance: Normal appearance.  HENT:     Head: Normocephalic and atraumatic.  Cardiovascular:     Rate and Rhythm: Normal rate and regular rhythm.  Pulmonary:     Effort: Pulmonary effort is normal.     Breath sounds: Normal breath sounds. No wheezing or rales.  Abdominal:     Palpations: Abdomen is soft.     Tenderness: There is no abdominal tenderness.  Neurological:     General: No focal deficit present.     Mental Status: He is alert.      No results found for any visits on 07/07/22.    The 10-year ASCVD risk score (Arnett DK, et al., 2019) is: 16.4%    Assessment & Plan:   #1 history of CAD with prior cardiac stent.  He has hyperlipidemia treated with atorvastatin 40 mg daily.  -Recheck lipid and hepatic panel  #2 history of BPH.  Discussed pros and cons of PSA screening and he would like to proceed.  PSA ordered today Continue to avoid anticholinergic medications.  #3 osteoarthritis involving multiple joints.  Caution about frequent use of nonsteroidals.  Try to avoid regular use of any nonsteroidal specially with his cardiac history.   No follow-ups on file.    Evelena Peat, MD

## 2022-07-12 ENCOUNTER — Other Ambulatory Visit: Payer: Self-pay | Admitting: Family Medicine

## 2022-08-09 DIAGNOSIS — M9903 Segmental and somatic dysfunction of lumbar region: Secondary | ICD-10-CM | POA: Diagnosis not present

## 2022-08-09 DIAGNOSIS — M9904 Segmental and somatic dysfunction of sacral region: Secondary | ICD-10-CM | POA: Diagnosis not present

## 2022-08-09 DIAGNOSIS — M5136 Other intervertebral disc degeneration, lumbar region: Secondary | ICD-10-CM | POA: Diagnosis not present

## 2022-08-09 DIAGNOSIS — M9905 Segmental and somatic dysfunction of pelvic region: Secondary | ICD-10-CM | POA: Diagnosis not present

## 2022-08-15 DIAGNOSIS — M9904 Segmental and somatic dysfunction of sacral region: Secondary | ICD-10-CM | POA: Diagnosis not present

## 2022-08-15 DIAGNOSIS — M9903 Segmental and somatic dysfunction of lumbar region: Secondary | ICD-10-CM | POA: Diagnosis not present

## 2022-08-15 DIAGNOSIS — M9905 Segmental and somatic dysfunction of pelvic region: Secondary | ICD-10-CM | POA: Diagnosis not present

## 2022-08-15 DIAGNOSIS — M5136 Other intervertebral disc degeneration, lumbar region: Secondary | ICD-10-CM | POA: Diagnosis not present

## 2022-08-22 ENCOUNTER — Other Ambulatory Visit: Payer: Self-pay | Admitting: Family Medicine

## 2022-09-12 DIAGNOSIS — M9904 Segmental and somatic dysfunction of sacral region: Secondary | ICD-10-CM | POA: Diagnosis not present

## 2022-09-12 DIAGNOSIS — M9903 Segmental and somatic dysfunction of lumbar region: Secondary | ICD-10-CM | POA: Diagnosis not present

## 2022-09-12 DIAGNOSIS — M5136 Other intervertebral disc degeneration, lumbar region: Secondary | ICD-10-CM | POA: Diagnosis not present

## 2022-09-12 DIAGNOSIS — M9905 Segmental and somatic dysfunction of pelvic region: Secondary | ICD-10-CM | POA: Diagnosis not present

## 2022-10-09 DIAGNOSIS — M9904 Segmental and somatic dysfunction of sacral region: Secondary | ICD-10-CM | POA: Diagnosis not present

## 2022-10-09 DIAGNOSIS — M9905 Segmental and somatic dysfunction of pelvic region: Secondary | ICD-10-CM | POA: Diagnosis not present

## 2022-10-09 DIAGNOSIS — M9903 Segmental and somatic dysfunction of lumbar region: Secondary | ICD-10-CM | POA: Diagnosis not present

## 2022-10-09 DIAGNOSIS — M5136 Other intervertebral disc degeneration, lumbar region: Secondary | ICD-10-CM | POA: Diagnosis not present

## 2022-10-13 ENCOUNTER — Ambulatory Visit (INDEPENDENT_AMBULATORY_CARE_PROVIDER_SITE_OTHER): Payer: Medicare Other | Admitting: Family Medicine

## 2022-10-13 ENCOUNTER — Encounter: Payer: Self-pay | Admitting: Family Medicine

## 2022-10-13 VITALS — BP 130/86 | HR 80 | Temp 97.7°F | Ht 68.5 in | Wt 176.0 lb

## 2022-10-13 DIAGNOSIS — H6122 Impacted cerumen, left ear: Secondary | ICD-10-CM | POA: Diagnosis not present

## 2022-10-13 DIAGNOSIS — M5432 Sciatica, left side: Secondary | ICD-10-CM

## 2022-10-13 DIAGNOSIS — Z23 Encounter for immunization: Secondary | ICD-10-CM | POA: Diagnosis not present

## 2022-10-13 MED ORDER — MELOXICAM 15 MG PO TABS: 15.0000 mg | ORAL_TABLET | Freq: Every day | ORAL | 0 refills | Status: DC

## 2022-10-13 NOTE — Progress Notes (Signed)
Established Patient Office Visit  Subjective   Patient ID: [name], male    DOB: 08/18/1951  Age: 71 y.o. MRN: [id number]  Chief Complaint  Patient presents with   Ear Fullness    HPI   Seen with left ear fullness past 2+ weeks.  Has had similar symptoms in the past with history of recurrent cerumen impactions.  Tried over-the-counter irrigation without much improvement.  Currently has no right ear symptoms.  No drainage from the left ear.  No pain.  Muffled hearing.  No dizziness.  He also has occasional sciatica type symptoms left side.  He states he is taking diclofenac in the past which has not helped much but he had some meloxicam the other day 15 mg which helped significantly more.  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 Coronary artery disease with 80% proximal and ostial stenosis of      the left anterior descending artery, no significant obstruction of      circumflex and right coronary arteries, and normal left ventricular      function.  2. Successful stenting of the lesion in the ostium of the left      anterior descending artery by using a Promus drug-eluting stent      with IVUS guidance, w   Past Surgical History:  Procedure Laterality Date   ANAL FISTULECTOMY  2004   CORONARY STENT PLACEMENT  2008   VENTRAL HERNIA REPAIR  Bilateral 08/26/2020   Procedure: LAPAROSCOPIC VENTRAL WALL HERNIA REPAIR,   WITH MESH; TAP BLOCK BILATERAL;  Surgeon: Karie Soda, MD;  Location: [facility] [place];  Service: General;  Laterality: Bilateral;   WRIST FUSION  12/2009   left    reports that he quit smoking about 14 years ago. His smoking use included cigarettes. He has a 17.50 pack-year smoking history. He has never used smokeless tobacco. He reports that he does not currently use alcohol. He reports that he does not use drugs. family history includes COPD in his mother; Cancer (age of onset: 65) in his sister; Cancer (age of onset: 37) in his brother; Colon cancer in his brother; Colon polyps in his sister; Heart disease (age of onset: 57) in his father. Allergies  Allergen Reactions   Codeine     Hives???    Review of Systems  Constitutional:  Negative for chills and fever.  HENT:  Negative for ear discharge, ear pain and tinnitus.       Objective:     BP 130/86   Pulse 80   Temp 97.7 F (36.5 C) (Oral)   Ht 5' 8.5" (1.74 m)   Wt 176 lb (79.8 kg)   SpO2 98%   BMI 26.37 kg/m    Physical Exam Vitals reviewed.  Constitutional:  Appearance: Normal appearance.  HENT:     Ears:     Comments: Cerumen impaction left canal.  Right canal and eardrum appear normal. Neurological:     Mental Status: He is alert.      No results found for any visits on 10/13/22.    The 10-year ASCVD risk score (Arnett DK, et al., 2019) is: 16.1%    Assessment & Plan:   #1 cerumen impaction left canal.  Patient aware of risk of irrigation including low risk of perforation, bleeding, pain, dizziness.  Patient consented and ear was irrigated and wax removed without difficulty.  Eardrum appears normal.  #2 intermittent sciatica type symptoms.  Patient has had good success with meloxicam in the past we wrote for 15 mg 1 daily as needed.  Reminded to take with food and use caution for any GI upset  Evelena Peat,  MD

## 2022-10-18 ENCOUNTER — Encounter: Payer: Self-pay | Admitting: Family Medicine

## 2022-10-18 DIAGNOSIS — Z1211 Encounter for screening for malignant neoplasm of colon: Secondary | ICD-10-CM

## 2022-10-26 ENCOUNTER — Encounter: Payer: Self-pay | Admitting: Gastroenterology

## 2022-11-17 ENCOUNTER — Telehealth: Payer: Self-pay | Admitting: *Deleted

## 2022-11-17 NOTE — Telephone Encounter (Signed)
Yesi,   This pt is a documented difficult intubation and his procedure will need to be done at the hospital.   Thanks,  Mckoy Bhakta 

## 2022-11-22 ENCOUNTER — Other Ambulatory Visit: Payer: Self-pay

## 2022-11-22 ENCOUNTER — Telehealth: Payer: Self-pay

## 2022-11-22 DIAGNOSIS — Z800 Family history of malignant neoplasm of digestive organs: Secondary | ICD-10-CM

## 2022-11-22 NOTE — Progress Notes (Signed)
Called and spoke to patient.  He agreed to procedure  on Monday morning, 12-18-22. Case was scheduled for 8:15am. case # [id number]  Per patient request patient was scheduled for PV this Friday - telephone visit - at 1:00pm and will get the details for prep and times to arrive.

## 2022-11-22 NOTE — Telephone Encounter (Signed)
Okay thanks for letting me know. Looks like he has a strong family history of colon cancer and is overdue for a surveillance colonoscopy. If he wishes to have colonoscopy done it will need to be done in the hospital. Will add him to the wait list and he will be contacted for scheduling - wait time could be anywhere from 1-3 months or so. [place] can you let him know? Thanks

## 2022-11-22 NOTE — Telephone Encounter (Signed)
Called and spoke to patient.  He agreed to colonoscopy procedure  on 12-18-22, Monday morning. He was scheduled for 8:15am. case # [id number]  Patient was scheduled for PV this Friday - telephone visit - at 1:00pm and will get the details for prep and times to arrive

## 2022-11-22 NOTE — Telephone Encounter (Signed)
Jan if you wouldn't mind calling this patient - we have an opening 2/26 at the hospital if he wishes to have an exam done at that time at the hospital. This is not a long wait to have a case done at the hospital - often it can be 3 months. Unfortunately given he was diagnosed with a difficult airway, he is not a candidate for outpatient anesthesia / sedation anywhere he seeks care, due to safety issues with his airway - this is not related to insurance at all.   If he wishes to proceed on 2/26 can you schedule him? If not, that is up to him if he wishes to seek care elsewhere. thanks

## 2022-11-22 NOTE — Telephone Encounter (Signed)
After reviewing this patients chart patient noted to be difficult Airway per Osvaldo Angst- due to anterior larynx. Patient will need to be rescheduled at [facility] or [facility] hospital for a recall colonoscopy. His PV is tomorrow so I will cancel it for now. PV will need to be rescheduled once his apt in confirmed at the hospital. I have attached Dr. Duanne Guess as well so that he is aware.   Thank you,  Catelynn Sparger, previsit.

## 2022-11-22 NOTE — Telephone Encounter (Signed)
Called and spoke with patient regarding findings after chart review. Pt was advised that [place] procedure had to be cancelled due to documented difficult airway. Pt states that he was never told this, I informed him that it was documented in his chart and was probably not mentioned to him. Pt stated that he thinks that we are switching his procedure to the hospital due to insurance reasons. I assured patient that this has nothing to do with his insurance, but his safety. I asked if patient wanted to come in to discuss this in person with Dr. Havery Moros, I offered him next available appt in April. Pt wanted to know how soon he could be scheduled for a colonoscopy, I informed patient that it could be anywhere from 1-3 months. Patient stated "Oh no, I'll find another doctor". Pt had no other concerns.

## 2022-11-24 ENCOUNTER — Ambulatory Visit (AMBULATORY_SURGERY_CENTER): Payer: Medicare Other | Admitting: *Deleted

## 2022-11-24 ENCOUNTER — Telehealth: Payer: Self-pay | Admitting: *Deleted

## 2022-11-24 VITALS — Ht 68.0 in | Wt 167.0 lb

## 2022-11-24 DIAGNOSIS — Z800 Family history of malignant neoplasm of digestive organs: Secondary | ICD-10-CM

## 2022-11-24 DIAGNOSIS — Z1211 Encounter for screening for malignant neoplasm of colon: Secondary | ICD-10-CM

## 2022-11-24 MED ORDER — NA SULFATE-K SULFATE-MG SULF 17.5-3.13-1.6 GM/177ML PO SOLN: 1.0000 | Freq: Once | ORAL | 0 refills | Status: AC

## 2022-11-24 NOTE — Progress Notes (Signed)
No egg or soy allergy known to patient  No issues known to pt with past sedation with any surgeries or procedures Patient denies ever being told they had issues or difficulty with intubation  No FH of Malignant Hyperthermia Pt is not on diet pills Pt is not on  home 02  Pt is not on blood thinners  Pt denies issues with constipation  Pt is not on dialysis Pt denies any upcoming cardiac testing Pt encouraged to use to use Singlecare or Goodrx to reduce cost  Patient's chart reviewed by John Nulty CNRA prior to previsit and patient appropriate for the [facility].  Previsit completed and red dot placed by patient's name on their procedure day (on provider's schedule).  . Visit by phone Instructions reviewed with pt and pt states understanding. Instructed to review again prior to procedure. Pt states they will.  

## 2022-11-24 NOTE — Telephone Encounter (Signed)
2nd attempt to reach pt with 2nd # listed on profile. Not sent to VM.

## 2022-11-29 ENCOUNTER — Telehealth: Payer: Self-pay | Admitting: Gastroenterology

## 2022-11-29 NOTE — Telephone Encounter (Signed)
Inbound call from patient stating that he is currently scheduled to have a procedure with Dr. Havery Moros at [facility] on 2/26 and his sister will not be able to take him on that day. Patient stated he needed to reschedule and is requesting a Friday because that will be the only day his sister is able to bring him. Please advise.

## 2022-11-30 DIAGNOSIS — M9903 Segmental and somatic dysfunction of lumbar region: Secondary | ICD-10-CM | POA: Diagnosis not present

## 2022-11-30 DIAGNOSIS — M9904 Segmental and somatic dysfunction of sacral region: Secondary | ICD-10-CM | POA: Diagnosis not present

## 2022-11-30 DIAGNOSIS — M5136 Other intervertebral disc degeneration, lumbar region: Secondary | ICD-10-CM | POA: Diagnosis not present

## 2022-11-30 DIAGNOSIS — M9905 Segmental and somatic dysfunction of pelvic region: Secondary | ICD-10-CM | POA: Diagnosis not present

## 2022-11-30 NOTE — Telephone Encounter (Signed)
Left patient a detailed vm on his mobile phone letting him know that we have cancelled his [facility] colonoscopy appt on 12/18/22. I informed patient that unfortunately Dr. Havery Moros does not have hospital availability on any Fridays. I informed patient that he can choose between 01/09/23, 02/01/23, or 01/2523. I asked that patient give [place] a call back to confirm what date works for him so that we can get him rescheduled.

## 2022-12-01 ENCOUNTER — Encounter: Payer: Self-pay | Admitting: Internal Medicine

## 2022-12-01 NOTE — Telephone Encounter (Signed)
k

## 2022-12-04 NOTE — Telephone Encounter (Signed)
Called to follow up with patient. He states that he is transferring care to [facility] since they will be able to get him in sooner. Pt had no other concerns at the end of the call.  Patient has been removed from Dr. Doyne Keel hospital wait list.

## 2022-12-08 ENCOUNTER — Encounter: Payer: Medicare Other | Admitting: Gastroenterology

## 2022-12-08 NOTE — Telephone Encounter (Signed)
Transferred GI care to [facility].

## 2022-12-18 ENCOUNTER — Encounter (HOSPITAL_COMMUNITY): Payer: Self-pay

## 2022-12-18 ENCOUNTER — Ambulatory Visit (HOSPITAL_COMMUNITY): Admit: 2022-12-18 | Payer: Medicare Other | Admitting: Gastroenterology

## 2022-12-18 SURGERY — COLONOSCOPY WITH PROPOFOL
Anesthesia: Monitor Anesthesia Care

## 2023-01-08 DIAGNOSIS — M79641 Pain in right hand: Secondary | ICD-10-CM | POA: Diagnosis not present

## 2023-01-08 DIAGNOSIS — M13831 Other specified arthritis, right wrist: Secondary | ICD-10-CM | POA: Diagnosis not present

## 2023-01-08 DIAGNOSIS — M79642 Pain in left hand: Secondary | ICD-10-CM | POA: Diagnosis not present

## 2023-01-17 ENCOUNTER — Other Ambulatory Visit: Payer: Self-pay | Admitting: Family Medicine

## 2023-01-18 ENCOUNTER — Other Ambulatory Visit: Payer: Self-pay | Admitting: Family Medicine

## 2023-02-12 DIAGNOSIS — M5136 Other intervertebral disc degeneration, lumbar region: Secondary | ICD-10-CM | POA: Diagnosis not present

## 2023-02-12 DIAGNOSIS — M9904 Segmental and somatic dysfunction of sacral region: Secondary | ICD-10-CM | POA: Diagnosis not present

## 2023-02-12 DIAGNOSIS — M9905 Segmental and somatic dysfunction of pelvic region: Secondary | ICD-10-CM | POA: Diagnosis not present

## 2023-02-12 DIAGNOSIS — M9903 Segmental and somatic dysfunction of lumbar region: Secondary | ICD-10-CM | POA: Diagnosis not present

## 2023-04-17 DIAGNOSIS — M5136 Other intervertebral disc degeneration, lumbar region: Secondary | ICD-10-CM | POA: Diagnosis not present

## 2023-04-17 DIAGNOSIS — M9904 Segmental and somatic dysfunction of sacral region: Secondary | ICD-10-CM | POA: Diagnosis not present

## 2023-04-17 DIAGNOSIS — M9903 Segmental and somatic dysfunction of lumbar region: Secondary | ICD-10-CM | POA: Diagnosis not present

## 2023-04-17 DIAGNOSIS — M9905 Segmental and somatic dysfunction of pelvic region: Secondary | ICD-10-CM | POA: Diagnosis not present

## 2023-04-20 ENCOUNTER — Encounter: Payer: Self-pay | Admitting: Internal Medicine

## 2023-04-20 ENCOUNTER — Ambulatory Visit: Payer: Medicare Other | Attending: Internal Medicine | Admitting: Internal Medicine

## 2023-04-20 VITALS — BP 142/84 | HR 59 | Ht 68.0 in | Wt 176.0 lb

## 2023-04-20 DIAGNOSIS — I2510 Atherosclerotic heart disease of native coronary artery without angina pectoris: Secondary | ICD-10-CM | POA: Diagnosis not present

## 2023-04-20 MED ORDER — ATORVASTATIN CALCIUM 40 MG PO TABS: 40.0000 mg | ORAL_TABLET | Freq: Every day | ORAL | 3 refills | Status: DC

## 2023-04-20 NOTE — Progress Notes (Unsigned)
Cardiology Office Note   Date:  04/21/2023   ID:  [name], DOB 07-27-1951, MRN [id number]  PCP:  Kristian Covey, MD  Cardiologist:   Dietrich Pates, MD       History of Present Illness: [name] is a 72 y.o. male with a history of CAD (s/p PTCA/stent to the LAD in 2008.  I saw the pt in Aug 2021  Since seen he continues to do well   Very active   Activity is limited though by hammer toes    He denies CP  No SOB   BP at home 120s/  Current Meds  Medication Sig   ALPHA LIPOIC ACID PO Take 100 mg by mouth daily.   aspirin EC 81 MG tablet Take 81 mg by mouth daily. Swallow whole. Pt stopped pt preference   Coenzyme Q10 (COQ10 PO) Take by mouth daily.   desoximetasone (TOPICORT) 0.25 % cream APPLY TO AFFECTED AREA AS NEEDED 1-2 TIMES PER DAY   diclofenac (VOLTAREN) 75 MG EC tablet Take 75 mg by mouth as needed for moderate pain.   famotidine (PEPCID) 20 MG tablet Take 20 mg by mouth 2 (two) times daily.   fish oil-omega-3 fatty acids 1000 MG capsule Take 1 g by mouth daily.   Fluticasone Propionate (FLONASE NA) Place into the nose 2 (two) times daily as needed.   magnesium citrate SOLN Take 1 Bottle by mouth daily.   MELATONIN PO Take by mouth daily. 25mg .  At bedtime   meloxicam (MOBIC) 15 MG tablet TAKE 1 TABLET (15 MG TOTAL) BY MOUTH DAILY.   OVER THE COUNTER MEDICATION asthan 10 mg daily   OVER THE COUNTER MEDICATION dmar  150 mg daily   OVER THE COUNTER MEDICATION Mushroom extract 2 caps daily   OVER THE COUNTER MEDICATION Green tea 1 daily   OVER THE COUNTER MEDICATION bioperine (black pepper) 1 cap 10 mg daily   OVER THE COUNTER MEDICATION    OVER THE COUNTER MEDICATION hrmp oil     1000 mg ( 1 dropper daily)   OVER THE COUNTER MEDICATION Take 1 Capful by mouth daily at 6 (six) AM. TAURINE SUPPEMEMENT   OVER THE COUNTER MEDICATION Take 1 Capful by mouth daily at 6 (six) AM. CREATINE MOPOHYDRATE SUPPLEMENT   Probiotic Product (PROBIOTIC-10 PO) Take by  mouth daily.    saw palmetto 160 MG capsule Take 160 mg by mouth 2 (two) times daily.   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 Coronary artery disease with 80% proximal and ostial stenosis of      the left anterior descending artery, no significant obstruction of      circumflex and right coronary arteries, and normal left ventricular  function.  2. Successful stenting of the lesion in the ostium of the left      anterior descending artery by using a Promus drug-eluting stent      with IVUS guidance, w    Past Surgical History:  Procedure Laterality Date   ANAL FISTULECTOMY  2004   CORONARY STENT PLACEMENT  2008   VENTRAL HERNIA REPAIR Bilateral 08/26/2020   Procedure: LAPAROSCOPIC VENTRAL WALL HERNIA REPAIR,   WITH MESH; TAP BLOCK BILATERAL;  Surgeon: Karie Soda, MD;  Location: [facility];  Service: General;  Laterality: Bilateral;   WRIST FUSION  12/2009   left     Social History:  The patient  reports that he quit smoking about 15 years ago. His smoking use included cigarettes. He has a 17.50 pack-year smoking history. He has never used smokeless tobacco. He reports that he does not currently use alcohol. He reports that he does not use drugs.   Family History:  The patient's family history includes COPD in his  mother; Cancer (age of onset: 45) in his sister; Cancer (age of onset: 51) in his brother; Colon cancer in his brother; Colon polyps in his sister; Heart disease (age of onset: 28) in his father.    ROS:  Please see the history of present illness. All other systems are reviewed and  Negative to the above problem except as noted.    PHYSICAL EXAM: VS:  BP (!) 142/84   Pulse (!) 59   Ht 5\' 8"  (1.727 m)   Wt 176 lb (79.8 kg)   SpO2 95%   BMI 26.76 kg/m   GEN: Well nourished, well developed, in no acute distress  HEENT: normal  Neck: no JVD, carotid bruit Cardiac: RRR; no murmur  No LE  edema  Respiratory:  clear to auscultation bilaterally GI: soft, nontender  No hepatomegaly   EKG:  EKG shows sinus bradycardia   59 bpm     Lipid Panel    Component Value Date/Time   CHOL 152 07/07/2022 1118   TRIG 47.0 07/07/2022 1118   HDL 75.50 07/07/2022 1118   CHOLHDL 2 07/07/2022 1118   VLDL 9.4 07/07/2022 1118   LDLCALC 67 07/07/2022 1118   LDLDIRECT 132.7 02/03/2009 0906      Wt Readings from Last 3 Encounters:  04/20/23 176 lb (79.8 kg)  11/24/22 167 lb (75.8 kg)  10/13/22 176 lb (79.8 kg)      ASSESSMENT AND PLAN:  1  CAD   Remote intervention  Asymptomatic   Active   Continue to follow   2  HLD   Last lipids in Sept 2023 LDL 67  HDL 75  Keep on same regimen     3  Blood pressure   Good control at home   I have encouraged him to bring cuff in to compare to cuff here or at PCP  Follow up in a couple years   Sooner if notices symptoms     Current medicines are reviewed at length with the patient today.  The patient does not have concerns regarding medicines.  Signed, Dietrich Pates, MD  04/21/2023 5:50 PM    [facility] [address] [place], [place], [place]  [address] Phone: [phone]; Fax: [phone]   HPI [name] is a 72 year old with a history of CAD (s/p PTCA/stent to the LAD in 2008.  I last saw him in sping 2011. Since see he has been  followed by B. Burchette  I saw the pt in 2013 and he was seen again by L Gerhardt in 2014  The pt continues to do well  He denies CP  Breathing is OK   He is very active    Diet Br  Egg sandwich   Oatmeal   Coffee   Lunch:  Skips    Dinner:    Veggies   Some meat  Not   Allergies  Allergen Reactions   Codeine     Hives???    Current Outpatient Medications  Medication Sig Dispense Refill   ALPHA LIPOIC ACID PO Take 100 mg by mouth daily.     aspirin EC 81 MG tablet Take 81 mg by mouth daily. Swallow whole. Pt stopped pt preference     atorvastatin (LIPITOR) 40 MG tablet TAKE 1 TABLET BY MOUTH EVERY DAY 90 tablet 1   Coenzyme Q10 (COQ10 PO) Take by mouth daily.     desoximetasone (TOPICORT) 0.25 % cream APPLY TO AFFECTED AREA AS NEEDED 1-2 TIMES PER DAY 60 g 1   diclofenac (VOLTAREN) 75 MG EC tablet Take 75 mg by mouth as needed for moderate pain.     famotidine (PEPCID) 20 MG tablet Take 20 mg by mouth 2 (two) times daily.     fish oil-omega-3 fatty acids 1000 MG capsule Take 1 g by mouth daily.     Fluticasone Propionate (FLONASE NA) Place into the nose 2 (two) times daily as needed.     magnesium citrate SOLN Take 1 Bottle by mouth daily.     MELATONIN PO Take by mouth daily. 25mg .  At bedtime     meloxicam (MOBIC) 15 MG tablet TAKE 1 TABLET (15 MG TOTAL) BY MOUTH DAILY. 90 tablet 0   OVER THE COUNTER MEDICATION asthan 10 mg daily     OVER THE COUNTER MEDICATION dmar  150 mg daily     OVER THE COUNTER MEDICATION Mushroom extract 2 caps daily     OVER THE COUNTER MEDICATION Green tea 1 daily     OVER THE COUNTER MEDICATION bioperine (black pepper) 1 cap 10 mg daily     OVER THE COUNTER MEDICATION      OVER THE COUNTER MEDICATION hrmp oil     1000 mg ( 1 dropper daily)     OVER THE COUNTER MEDICATION Take 1 Capful by mouth daily at 6 (six) AM. TAURINE SUPPEMEMENT     OVER THE COUNTER MEDICATION Take 1 Capful by mouth daily at 6 (six) AM. CREATINE MOPOHYDRATE SUPPLEMENT      Probiotic Product (PROBIOTIC-10 PO) Take by mouth daily.      saw palmetto 160 MG capsule Take 160 mg by mouth 2 (two) times daily.     No current facility-administered medications for this visit.    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 Coronary artery disease with 80% proximal and ostial stenosis of  the left anterior descending artery, no significant obstruction of      circumflex and right coronary arteries, and normal left ventricular      function.  2. Successful stenting of the lesion in the ostium of the left      anterior descending artery by using a Promus drug-eluting stent      with IVUS guidance, w    Past Surgical History:  Procedure Laterality Date   ANAL FISTULECTOMY  2004   CORONARY STENT PLACEMENT  2008   VENTRAL HERNIA REPAIR Bilateral 08/26/2020   Procedure: LAPAROSCOPIC VENTRAL WALL HERNIA REPAIR,   WITH MESH; TAP BLOCK BILATERAL;  Surgeon: Karie Soda, MD;  Location: [facility];  Service: General;  Laterality: Bilateral;   WRIST FUSION  12/2009   left    Family History  Problem Relation Age of Onset   COPD Mother    Heart disease Father 33       CHF   Cancer Sister 66       ?lung cancer   Colon polyps Sister    Cancer Brother 74       colon cancer   Colon cancer Brother     Social History   Socioeconomic History   Marital status: Single    Spouse name: Not on file   Number of children: 0    Years of education: Not on file   Highest education level: Not on file  Occupational History   Occupation: Airline pilot    Comment: full time  Tobacco Use   Smoking status: Former    Packs/day: 0.50    Years: 35.00    Additional pack years: 0.00    Total pack years: 17.50    Types: Cigarettes    Quit date: 03/23/2008    Years since quitting: 15.0   Smokeless tobacco: Never  Vaping Use   Vaping Use: Never used  Substance and Sexual Activity   Alcohol use: Not Currently    Comment: 1-2 drinks a year   Drug use: No   Sexual activity: Not on file  Other Topics Concern   Not on file  Social History Narrative   Lives alone with 2 dogs, one level living   [place], drives much for work   Enjoys doing Presenter, broadcasting, environmentally-conscious   Social Determinants of Health   Financial Resource Strain: Low Risk  (04/18/2022)   Overall Financial Resource Strain (CARDIA)    Difficulty of Paying Living Expenses: Not hard at all  Food Insecurity: No Food Insecurity (04/18/2022)   Hunger Vital Sign    Worried About Running Out of Food in the Last Year: Never true    Ran Out of Food in the Last Year: Never true  Transportation Needs: No Transportation Needs (04/18/2022)   PRAPARE - Administrator, Civil Service (Medical): No    Lack of Transportation (Non-Medical): No  Physical Activity: Sufficiently Active (04/18/2022)   Exercise Vital Sign    Days of Exercise per Week: 5 days    Minutes of Exercise per Session: 30 min  Stress: No Stress Concern Present (04/18/2022)   [employer] of Occupational Health - Occupational Stress Questionnaire    Feeling of Stress : Not at all  Social Connections: Unknown (12/01/2021)   Social Connection and Isolation Panel [NHANES]    Frequency of Communication with Friends and Family: Patient declined    Frequency of Social Gatherings with Friends and Family: Patient declined    Attends Religious Services: Patient declined  Active Member of Clubs or  Organizations: No    Attends Banker Meetings: Not on file    Marital Status: Never married  Intimate Partner Violence: Not At Risk (04/12/2021)   Humiliation, Afraid, Rape, and Kick questionnaire    Fear of Current or Ex-Partner: No    Emotionally Abused: No    Physically Abused: No    Sexually Abused: No    Review of Systems:  All systems reviewed.  They are negative to the above problem except as previously stated.  Vital Signs: BP (!) 142/84   Pulse (!) 59   Ht 5\' 8"  (1.727 m)   Wt 176 lb (79.8 kg)   SpO2 95%   BMI 26.76 kg/m   Physical Exam Patient is in NAD HEENT:  Normocephalic, atraumatic. EOMI, PERRLA.   Neck: JVP is normal.No bruits.   Lungs: clear to auscultation. No rales   Heart: Regular rate and rhythm. Normal S1, S2. No S3.   Nomurmurs  Abdomen:  Supple  + ventral hernia  Reduces  No masses  No hepatomegalty Extremities:   Good distal pulses throughout. No lower extremity edema.   Musculoskeletal :moving all extremities.   Neuro:   alert and oriented x3.  CN II-XII grossly intact.  EKG:  SB  59  Nonspecific ST changes       Assessment and Plan:  1 CAD  Pt with hx of CAD   Contines to do well   No symptoms of angina   Keep on ec ASA 81 mg 2  HL   Excellent control of LDL   66 on last check in June   HDL 91   Keep on lipitor 3  Preop eval  From a cardiac standpoint pt should do well   He is at low risk for a major cardiac event  Will be available as needed   PT has done so well  Risk factors under control     .

## 2023-04-20 NOTE — Patient Instructions (Signed)
Medication Instructions:  Your physician recommends that you continue on your current medications as directed. Please refer to the Current Medication list given to you today.  *If you need a refill on your cardiac medications before your next appointment, please call your pharmacy*   Follow-Up: At [facility], you and your health needs are our priority.  As part of our continuing mission to provide you with exceptional heart care, we have created designated Provider Care Teams.  These Care Teams include your primary Cardiologist (physician) and Advanced Practice Providers (APPs -  Physician Assistants and Nurse Practitioners) who all work together to provide you with the care you need, when you need it.  We recommend signing up for the patient portal called "MyChart".  Sign up information is provided on this After Visit Summary.  MyChart is used to connect with patients for Virtual Visits (Telemedicine).  Patients are able to view lab/test results, encounter notes, upcoming appointments, etc.  Non-urgent messages can be sent to your provider as well.   To learn more about what you can do with MyChart, go to [email or web address].    Your next appointment:   3 year(s)  Provider:   Dietrich Pates, MD

## 2023-04-23 ENCOUNTER — Other Ambulatory Visit: Payer: Self-pay | Admitting: Family Medicine

## 2023-05-22 DIAGNOSIS — M9904 Segmental and somatic dysfunction of sacral region: Secondary | ICD-10-CM | POA: Diagnosis not present

## 2023-05-22 DIAGNOSIS — M9903 Segmental and somatic dysfunction of lumbar region: Secondary | ICD-10-CM | POA: Diagnosis not present

## 2023-05-22 DIAGNOSIS — M9905 Segmental and somatic dysfunction of pelvic region: Secondary | ICD-10-CM | POA: Diagnosis not present

## 2023-05-22 DIAGNOSIS — M5136 Other intervertebral disc degeneration, lumbar region: Secondary | ICD-10-CM | POA: Diagnosis not present

## 2023-06-22 ENCOUNTER — Other Ambulatory Visit: Payer: Self-pay | Admitting: Family Medicine

## 2023-06-26 NOTE — Telephone Encounter (Signed)
Left a message for the patient to return my call.  

## 2023-06-29 NOTE — Telephone Encounter (Signed)
I spoke with the patient and he reported that he declined office visit follow up at this time. Patient stated that it is "ridiculous to come in for a anti-inflammatory drug and will find a new physician".

## 2023-07-27 ENCOUNTER — Other Ambulatory Visit: Payer: Self-pay | Admitting: Family Medicine

## 2023-07-27 DIAGNOSIS — Z1212 Encounter for screening for malignant neoplasm of rectum: Secondary | ICD-10-CM

## 2023-07-27 DIAGNOSIS — Z1211 Encounter for screening for malignant neoplasm of colon: Secondary | ICD-10-CM

## 2023-08-02 DIAGNOSIS — Z1212 Encounter for screening for malignant neoplasm of rectum: Secondary | ICD-10-CM | POA: Diagnosis not present

## 2023-08-02 DIAGNOSIS — Z1211 Encounter for screening for malignant neoplasm of colon: Secondary | ICD-10-CM | POA: Diagnosis not present

## 2023-08-07 LAB — COLOGUARD: COLOGUARD: NEGATIVE

## 2023-08-16 DIAGNOSIS — M9905 Segmental and somatic dysfunction of pelvic region: Secondary | ICD-10-CM | POA: Diagnosis not present

## 2023-08-16 DIAGNOSIS — M9901 Segmental and somatic dysfunction of cervical region: Secondary | ICD-10-CM | POA: Diagnosis not present

## 2023-08-16 DIAGNOSIS — M9903 Segmental and somatic dysfunction of lumbar region: Secondary | ICD-10-CM | POA: Diagnosis not present

## 2023-08-16 DIAGNOSIS — M5136 Other intervertebral disc degeneration, lumbar region with discogenic back pain only: Secondary | ICD-10-CM | POA: Diagnosis not present

## 2023-09-19 DIAGNOSIS — M9903 Segmental and somatic dysfunction of lumbar region: Secondary | ICD-10-CM | POA: Diagnosis not present

## 2023-09-19 DIAGNOSIS — M5136 Other intervertebral disc degeneration, lumbar region with discogenic back pain only: Secondary | ICD-10-CM | POA: Diagnosis not present

## 2023-09-19 DIAGNOSIS — M9904 Segmental and somatic dysfunction of sacral region: Secondary | ICD-10-CM | POA: Diagnosis not present

## 2023-09-19 DIAGNOSIS — M9905 Segmental and somatic dysfunction of pelvic region: Secondary | ICD-10-CM | POA: Diagnosis not present

## 2023-10-09 DIAGNOSIS — R351 Nocturia: Secondary | ICD-10-CM | POA: Diagnosis not present

## 2023-10-09 DIAGNOSIS — E785 Hyperlipidemia, unspecified: Secondary | ICD-10-CM | POA: Diagnosis not present

## 2023-10-09 DIAGNOSIS — Z0000 Encounter for general adult medical examination without abnormal findings: Secondary | ICD-10-CM | POA: Diagnosis not present

## 2023-10-09 DIAGNOSIS — M1990 Unspecified osteoarthritis, unspecified site: Secondary | ICD-10-CM | POA: Diagnosis not present

## 2023-10-09 DIAGNOSIS — Z23 Encounter for immunization: Secondary | ICD-10-CM | POA: Diagnosis not present

## 2023-10-09 DIAGNOSIS — I2510 Atherosclerotic heart disease of native coronary artery without angina pectoris: Secondary | ICD-10-CM | POA: Diagnosis not present

## 2023-10-09 DIAGNOSIS — M5430 Sciatica, unspecified side: Secondary | ICD-10-CM | POA: Diagnosis not present

## 2023-10-15 DIAGNOSIS — M9903 Segmental and somatic dysfunction of lumbar region: Secondary | ICD-10-CM | POA: Diagnosis not present

## 2023-10-15 DIAGNOSIS — M9904 Segmental and somatic dysfunction of sacral region: Secondary | ICD-10-CM | POA: Diagnosis not present

## 2023-10-15 DIAGNOSIS — M5136 Other intervertebral disc degeneration, lumbar region with discogenic back pain only: Secondary | ICD-10-CM | POA: Diagnosis not present

## 2023-10-15 DIAGNOSIS — M9905 Segmental and somatic dysfunction of pelvic region: Secondary | ICD-10-CM | POA: Diagnosis not present

## 2023-11-26 DIAGNOSIS — M9903 Segmental and somatic dysfunction of lumbar region: Secondary | ICD-10-CM | POA: Diagnosis not present

## 2023-11-26 DIAGNOSIS — M5136 Other intervertebral disc degeneration, lumbar region with discogenic back pain only: Secondary | ICD-10-CM | POA: Diagnosis not present

## 2023-11-26 DIAGNOSIS — M9905 Segmental and somatic dysfunction of pelvic region: Secondary | ICD-10-CM | POA: Diagnosis not present

## 2023-11-26 DIAGNOSIS — M9904 Segmental and somatic dysfunction of sacral region: Secondary | ICD-10-CM | POA: Diagnosis not present

## 2023-12-25 DIAGNOSIS — M9903 Segmental and somatic dysfunction of lumbar region: Secondary | ICD-10-CM | POA: Diagnosis not present

## 2023-12-25 DIAGNOSIS — M9904 Segmental and somatic dysfunction of sacral region: Secondary | ICD-10-CM | POA: Diagnosis not present

## 2023-12-25 DIAGNOSIS — M9905 Segmental and somatic dysfunction of pelvic region: Secondary | ICD-10-CM | POA: Diagnosis not present

## 2023-12-25 DIAGNOSIS — M5136 Other intervertebral disc degeneration, lumbar region with discogenic back pain only: Secondary | ICD-10-CM | POA: Diagnosis not present

## 2024-01-21 DIAGNOSIS — M9903 Segmental and somatic dysfunction of lumbar region: Secondary | ICD-10-CM | POA: Diagnosis not present

## 2024-01-21 DIAGNOSIS — M5136 Other intervertebral disc degeneration, lumbar region with discogenic back pain only: Secondary | ICD-10-CM | POA: Diagnosis not present

## 2024-01-21 DIAGNOSIS — M9905 Segmental and somatic dysfunction of pelvic region: Secondary | ICD-10-CM | POA: Diagnosis not present

## 2024-01-21 DIAGNOSIS — M9904 Segmental and somatic dysfunction of sacral region: Secondary | ICD-10-CM | POA: Diagnosis not present

## 2024-02-25 DIAGNOSIS — M5136 Other intervertebral disc degeneration, lumbar region with discogenic back pain only: Secondary | ICD-10-CM | POA: Diagnosis not present

## 2024-02-25 DIAGNOSIS — M9904 Segmental and somatic dysfunction of sacral region: Secondary | ICD-10-CM | POA: Diagnosis not present

## 2024-02-25 DIAGNOSIS — M9905 Segmental and somatic dysfunction of pelvic region: Secondary | ICD-10-CM | POA: Diagnosis not present

## 2024-02-25 DIAGNOSIS — M9903 Segmental and somatic dysfunction of lumbar region: Secondary | ICD-10-CM | POA: Diagnosis not present

## 2024-03-25 DIAGNOSIS — M9903 Segmental and somatic dysfunction of lumbar region: Secondary | ICD-10-CM | POA: Diagnosis not present

## 2024-03-25 DIAGNOSIS — M5136 Other intervertebral disc degeneration, lumbar region with discogenic back pain only: Secondary | ICD-10-CM | POA: Diagnosis not present

## 2024-03-25 DIAGNOSIS — M9904 Segmental and somatic dysfunction of sacral region: Secondary | ICD-10-CM | POA: Diagnosis not present

## 2024-03-25 DIAGNOSIS — M9905 Segmental and somatic dysfunction of pelvic region: Secondary | ICD-10-CM | POA: Diagnosis not present

## 2024-03-28 ENCOUNTER — Other Ambulatory Visit: Payer: Self-pay | Admitting: Internal Medicine

## 2024-04-29 ENCOUNTER — Telehealth: Payer: Self-pay | Admitting: *Deleted

## 2024-04-29 DIAGNOSIS — M722 Plantar fascial fibromatosis: Secondary | ICD-10-CM | POA: Diagnosis not present

## 2024-04-29 DIAGNOSIS — M2042 Other hammer toe(s) (acquired), left foot: Secondary | ICD-10-CM | POA: Diagnosis not present

## 2024-04-29 NOTE — Telephone Encounter (Signed)
 Sent to Damien Braver, NP in error. I meant to send this to preop APP to review. Pt was recently seen by Dr. Okey.

## 2024-04-29 NOTE — Telephone Encounter (Addendum)
   Pre-operative Risk Assessment    Patient Name: [name]  DOB: 1951-04-23 MRN: [id number]   Date of last office visit: 04/20/23 DR. ROSS Date of next office visit: NONE   Request for Surgical Clearance    Procedure:  LEFT FOOT 2ND, 3RD AND 4TH HAMMERTOE CORRECTIONS via PIP FUSION, 5TH TOE FLEXOR PERCUTANEOUS TENOTOMY, POSSIBLE PINNING   Date of Surgery:  Clearance TBD                                Surgeon:  DR. LILLIA MOUNTAIN Surgeon's Group or Practice Name:  [facility] Phone number:  [phone] MEGAN DAVIS Fax number:  [phone]   Type of Clearance Requested:   - Medical  - Pharmacy:  Hold Aspirin     Type of Anesthesia:  GENERAL WITH BLOCK  Additional requests/questions:    Bonney Niels Jest   04/29/2024, 10:53 AM

## 2024-04-29 NOTE — Telephone Encounter (Signed)
   Name: [name]  DOB: [birth year]  MRN: [id number]  Primary Cardiologist: Vina Gull, MD  Chart reviewed as part of pre-operative protocol coverage. Because of [name] past medical history and time since last visit, he will require a follow-up in-office visit in order to better assess preoperative cardiovascular risk.  Patient is overdue for annual follow-up.  Pre-op covering staff: - Please schedule appointment and call patient to inform them. If patient already had an upcoming appointment within acceptable timeframe, please add pre-op clearance to the appointment notes so provider is aware. - Please contact requesting surgeon's office via preferred method (i.e, phone, fax) to inform them of need for appointment prior to surgery.  Billy JAYSON Braver, NP  04/29/2024, 11:16 AM

## 2024-05-13 DIAGNOSIS — M9905 Segmental and somatic dysfunction of pelvic region: Secondary | ICD-10-CM | POA: Diagnosis not present

## 2024-05-13 DIAGNOSIS — M5136 Other intervertebral disc degeneration, lumbar region with discogenic back pain only: Secondary | ICD-10-CM | POA: Diagnosis not present

## 2024-05-13 DIAGNOSIS — M9903 Segmental and somatic dysfunction of lumbar region: Secondary | ICD-10-CM | POA: Diagnosis not present

## 2024-05-13 DIAGNOSIS — M9904 Segmental and somatic dysfunction of sacral region: Secondary | ICD-10-CM | POA: Diagnosis not present

## 2024-05-16 DIAGNOSIS — I2510 Atherosclerotic heart disease of native coronary artery without angina pectoris: Secondary | ICD-10-CM | POA: Diagnosis not present

## 2024-05-16 DIAGNOSIS — Z01818 Encounter for other preprocedural examination: Secondary | ICD-10-CM | POA: Diagnosis not present

## 2024-05-16 DIAGNOSIS — M79673 Pain in unspecified foot: Secondary | ICD-10-CM | POA: Diagnosis not present

## 2024-05-19 NOTE — Telephone Encounter (Signed)
 Late entry: forgot to document left message for pt to call back to schedule in office appt.    Addendum to the previous notes stating pt recently saw Dr. Okey, my error, the last appt with Dr. Okey was 03/2023, not 03/2024. Pt will need in office appt for preop clearance.

## 2024-05-22 NOTE — Telephone Encounter (Signed)
Left message for the pt to call back and schedule in office appt for pre op clearance.

## 2024-05-23 NOTE — Telephone Encounter (Signed)
 S/W pt and scheduled IN OFFICE Preop appt 06/13/24 with Lum Louis, NP   Will update surgeons office.

## 2024-06-12 NOTE — Progress Notes (Signed)
 " Cardiology Office Note:    Date:  06/13/2024  ID:  [name], DOB [birth year], MRN [id number] PCP: Micheal Wolm ORN, MD  [facility] Providers Cardiologist:  Vina Gull, MD Cardiology APP:  Rana Lum CROME, NP       Patient Profile:       Chief Complaint: Preoperative clearance History of Present Illness:  [name] is a 73 y.o. male with visit-pertinent history of coronary artery disease, hyperlipidemia, hypertension  He has history of CAD s/p PTCA/stent to LAD in 2008.  He was last seen in office on 04/12/2023 by Dr. Gull.  He was doing well at the time and his medication management was continued.  He is now pending left foot 2nd, 3rd, and 4th hammertoe corrections via PIP fusion, fifth toe flexor percutaneous tenotomy, possible pinning on date TBD with [facility]   Discussed the use of AI scribe software for clinical note transcription with the patient, who gave verbal consent to proceed.  History of Present Illness [name] is a 73 year old male with coronary artery disease who presents for cardiac clearance prior to orthopedic surgery.  Today he is doing well without acute cardiovascular concerns or complaints.  He denies any chest pain or exertional symptoms.  He underwent stent placement in 2008 after experiencing fatigue and chest pressure following yard work, relieved by aspirin. Since then, he has had no further cardiac issues and follows up regularly.  He maintains an active lifestyle, attending the gym three times a week for strength training and using a life cycle at home for aerobic exercise. He experiences no chest pain or shortness of breath during these activities. 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  He is without any syncope, presyncope, lightheadedness, dizziness, orthopnea, PND, leg swelling   Review of systems:  Please see the history of  present illness. All other systems are reviewed and otherwise negative.      Studies Reviewed:    EKG Interpretation Date/Time:  Friday June 13 2024 13:55:55 EDT Ventricular Rate:  76 PR Interval:  162 QRS Duration:  94 QT Interval:  386 QTC Calculation: 434 R Axis:   23  Text Interpretation: Normal sinus rhythm Normal ECG When compared with ECG of 20-Apr-2023 08:22, No significant change was found Confirmed by Rana Lum [phone]) on 06/13/2024 5:08:54 PM     Risk Assessment/Calculations:              Physical Exam:   VS:  BP 126/80 (BP Location: Left Arm, Patient Position: Sitting, Cuff Size: Normal)   Pulse 76   Ht 5' 10 (1.778 m)   Wt 181 lb (82.1 kg)   BMI 25.97 kg/m    Wt Readings from Last 3 Encounters:  06/13/24 181 lb (82.1 kg)  04/20/23 176 lb (79.8 kg)  11/24/22 167 lb (75.8 kg)    GEN: Well nourished, well developed in no acute distress NECK: No JVD; No carotid bruits CARDIAC: RRR, no murmurs, rubs, gallops RESPIRATORY:  Clear to auscultation without rales, wheezing or rhonchi  ABDOMEN: Soft, non-tender, non-distended EXTREMITIES:  No edema; No acute deformity      Assessment and Plan:  Coronary artery disease S/p PTCA/stent to LAD in 2008 EKG today without acute ischemic changes - Today he is without any anginal symptoms.  Has not had chest pain since original event in 2008.  He denies prior anginal equivalent (chest pressure).  He  continues to maintain an active lifestyle attending the gym 3 times a week for weight training and using lifecycle at home for aerobic exercise without any exertional symptoms.  There is no indication for further ischemic evaluation at this time - Continue aspirin 81 mg daily and atorvastatin  40 mg daily  Hyperlipidemia, LDL goal <70 LDL 67 on 06/2022 and well-controlled - Continue atorvastatin  40 mg  Hypertension Blood pressure today well-controlled at 126/80  Preoperative cardiovascular exam According to the  Revised Cardiac Risk Index (RCRI), his Perioperative Risk of Major Cardiac Event is (%): 0.9. His Functional Capacity in METs is: 6.61 according to the Duke Activity Status Index (DASI). Therefore, based on ACC/AHA guidelines, patient would be at acceptable risk for the planned procedure without further cardiovascular testing. I will route this recommendation to the requesting party via Epic fax function.  He may hold aspirin for 7 days prior to procedure. Please resume aspirin as soon as possible postprocedure, at the discretion of the surgeon.         Dispo: Follow-up in 2 years (he is on a 2-year f/u schedule with Dr. Okey).  Signed, Lum LITTIE Louis, NP  "

## 2024-06-13 ENCOUNTER — Encounter: Payer: Self-pay | Admitting: Emergency Medicine

## 2024-06-13 ENCOUNTER — Ambulatory Visit: Attending: Emergency Medicine | Admitting: Emergency Medicine

## 2024-06-13 VITALS — BP 126/80 | HR 76 | Ht 70.0 in | Wt 181.0 lb

## 2024-06-13 DIAGNOSIS — I2510 Atherosclerotic heart disease of native coronary artery without angina pectoris: Secondary | ICD-10-CM | POA: Insufficient documentation

## 2024-06-13 DIAGNOSIS — E785 Hyperlipidemia, unspecified: Secondary | ICD-10-CM | POA: Diagnosis not present

## 2024-06-13 DIAGNOSIS — I10 Essential (primary) hypertension: Secondary | ICD-10-CM | POA: Insufficient documentation

## 2024-06-13 DIAGNOSIS — Z01810 Encounter for preprocedural cardiovascular examination: Secondary | ICD-10-CM | POA: Diagnosis not present

## 2024-06-13 NOTE — Patient Instructions (Addendum)
 Medication Instructions:  No changes *If you need a refill on your cardiac medications before your next appointment, please call your pharmacy*  Lab Work: No labs If you have labs (blood work) drawn today and your tests are completely normal, you will receive your results only by: MyChart Message (if you have MyChart) OR A paper copy in the mail If you have any lab test that is abnormal or we need to change your treatment, we will call you to review the results.  Testing/Procedures: No testing  Follow-Up: At [facility], you and your health needs are our priority.  As part of our continuing mission to provide you with exceptional heart care, our providers are all part of one team.  This team includes your primary Cardiologist (physician) and Advanced Practice Providers or APPs (Physician Assistants and Nurse Practitioners) who all work together to provide you with the care you need, when you need it.  Your next appointment:   2 year(s)  Provider:   Vina Gull, MD    We recommend signing up for the patient portal called MyChart.  Sign up information is provided on this After Visit Summary.  MyChart is used to connect with patients for Virtual Visits (Telemedicine).  Patients are able to view lab/test results, encounter notes, upcoming appointments, etc.  Non-urgent messages can be sent to your provider as well.   To learn more about what you can do with MyChart, go to [email or web address].

## 2024-07-01 DIAGNOSIS — M9903 Segmental and somatic dysfunction of lumbar region: Secondary | ICD-10-CM | POA: Diagnosis not present

## 2024-07-01 DIAGNOSIS — M9904 Segmental and somatic dysfunction of sacral region: Secondary | ICD-10-CM | POA: Diagnosis not present

## 2024-07-01 DIAGNOSIS — M5136 Other intervertebral disc degeneration, lumbar region with discogenic back pain only: Secondary | ICD-10-CM | POA: Diagnosis not present

## 2024-07-01 DIAGNOSIS — M9905 Segmental and somatic dysfunction of pelvic region: Secondary | ICD-10-CM | POA: Diagnosis not present

## 2024-08-05 DIAGNOSIS — M9905 Segmental and somatic dysfunction of pelvic region: Secondary | ICD-10-CM | POA: Diagnosis not present

## 2024-08-05 DIAGNOSIS — M9903 Segmental and somatic dysfunction of lumbar region: Secondary | ICD-10-CM | POA: Diagnosis not present

## 2024-08-05 DIAGNOSIS — M9904 Segmental and somatic dysfunction of sacral region: Secondary | ICD-10-CM | POA: Diagnosis not present

## 2024-08-05 DIAGNOSIS — M51361 Other intervertebral disc degeneration, lumbar region with lower extremity pain only: Secondary | ICD-10-CM | POA: Diagnosis not present

## 2024-09-30 DIAGNOSIS — M9905 Segmental and somatic dysfunction of pelvic region: Secondary | ICD-10-CM | POA: Diagnosis not present

## 2024-09-30 DIAGNOSIS — M9904 Segmental and somatic dysfunction of sacral region: Secondary | ICD-10-CM | POA: Diagnosis not present

## 2024-09-30 DIAGNOSIS — M51361 Other intervertebral disc degeneration, lumbar region with lower extremity pain only: Secondary | ICD-10-CM | POA: Diagnosis not present

## 2024-09-30 DIAGNOSIS — M9903 Segmental and somatic dysfunction of lumbar region: Secondary | ICD-10-CM | POA: Diagnosis not present

## 2024-10-20 ENCOUNTER — Other Ambulatory Visit: Payer: Self-pay | Admitting: Family Medicine

## 2024-10-20 DIAGNOSIS — Z87891 Personal history of nicotine dependence: Secondary | ICD-10-CM

## 2024-10-28 ENCOUNTER — Inpatient Hospital Stay: Admission: RE | Admit: 2024-10-28 | Source: Ambulatory Visit

## 2024-10-30 ENCOUNTER — Other Ambulatory Visit: Payer: Self-pay | Admitting: Family Medicine

## 2024-10-30 DIAGNOSIS — Z87891 Personal history of nicotine dependence: Secondary | ICD-10-CM

## 2024-10-31 ENCOUNTER — Ambulatory Visit
Admission: RE | Admit: 2024-10-31 | Discharge: 2024-10-31 | Disposition: A | Source: Ambulatory Visit | Attending: Family Medicine | Admitting: Family Medicine

## 2024-10-31 ENCOUNTER — Inpatient Hospital Stay: Admission: RE | Admit: 2024-10-31 | Source: Ambulatory Visit

## 2024-10-31 DIAGNOSIS — Z87891 Personal history of nicotine dependence: Secondary | ICD-10-CM

## 2024-12-04 ENCOUNTER — Ambulatory Visit: Admitting: Allergy & Immunology
